# Patient Record
Sex: Female | Born: 1985
Health system: Southern US, Community
[De-identification: ages and names within clinical notes are randomized; demographics above are authoritative.]

## PROBLEM LIST (undated history)

## (undated) DIAGNOSIS — K589 Irritable bowel syndrome without diarrhea: Secondary | ICD-10-CM

## (undated) DIAGNOSIS — F32A Depression, unspecified: Secondary | ICD-10-CM

## (undated) DIAGNOSIS — T7840XA Allergy, unspecified, initial encounter: Secondary | ICD-10-CM

## (undated) DIAGNOSIS — R112 Nausea with vomiting, unspecified: Secondary | ICD-10-CM

## (undated) DIAGNOSIS — Z789 Other specified health status: Secondary | ICD-10-CM

## (undated) DIAGNOSIS — F329 Major depressive disorder, single episode, unspecified: Secondary | ICD-10-CM

## (undated) DIAGNOSIS — F988 Other specified behavioral and emotional disorders with onset usually occurring in childhood and adolescence: Secondary | ICD-10-CM

## (undated) DIAGNOSIS — Z9889 Other specified postprocedural states: Secondary | ICD-10-CM

## (undated) DIAGNOSIS — F419 Anxiety disorder, unspecified: Secondary | ICD-10-CM

## (undated) HISTORY — DX: Allergy, unspecified, initial encounter: T78.40XA

## (undated) HISTORY — DX: Anxiety disorder, unspecified: F41.9

## (undated) HISTORY — PX: ANTERIOR CRUCIATE LIGAMENT REPAIR: SHX115

## (undated) HISTORY — DX: Other specified behavioral and emotional disorders with onset usually occurring in childhood and adolescence: F98.8

---
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
## 2007-09-18 ENCOUNTER — Encounter: Admission: RE | Admit: 2007-09-18 | Discharge: 2007-09-18 | Payer: Self-pay | Admitting: General Surgery

## 2008-01-05 ENCOUNTER — Emergency Department (HOSPITAL_BASED_OUTPATIENT_CLINIC_OR_DEPARTMENT_OTHER): Admission: EM | Admit: 2008-01-05 | Discharge: 2008-01-05 | Payer: Self-pay | Admitting: Emergency Medicine

## 2010-01-01 NOTE — L&D Delivery Note (Signed)
Patient was C/C/2 and pushed for 15 minutes with epidural.   NSVD  female infant, Apgars 4,6, P; weight P.   The patient had one perineal lacerations repaired with 2-0 vicryl R. Fundus was firm. EBL was expected. Placenta was delivered intact. Vagina was clear.  Baby was pink and active but grunting; Neo called and Baby to [facility]u for grunting.  Aditi Rovira A

## 2010-01-22 ENCOUNTER — Encounter: Payer: Self-pay | Admitting: Gastroenterology

## 2010-04-17 LAB — URINALYSIS, ROUTINE W REFLEX MICROSCOPIC
Bilirubin Urine: NEGATIVE
Glucose, UA: NEGATIVE mg/dL
Nitrite: NEGATIVE
Protein, ur: NEGATIVE mg/dL
Specific Gravity, Urine: 1.028 (ref 1.005–1.030)
pH: 6 (ref 5.0–8.0)

## 2010-04-17 LAB — BASIC METABOLIC PANEL
Calcium: 9.3 mg/dL (ref 8.4–10.5)
Creatinine, Ser: 0.7 mg/dL (ref 0.4–1.2)
GFR calc Af Amer: >60 mL/min (ref >60)
GFR calc non Af Amer: >60 mL/min (ref >60)
Glucose, Bld: 134 mg/dL — ABNORMAL HIGH (ref 70–99)

## 2010-04-17 LAB — URINE MICROSCOPIC-ADD ON

## 2010-04-20 LAB — HIV ANTIBODY (ROUTINE TESTING W REFLEX): HIV: NONREACTIVE

## 2010-04-20 LAB — RUBELLA ANTIBODY, IGM: Rubella: IMMUNE

## 2010-04-20 LAB — ABO/RH: RH Type: POSITIVE

## 2010-04-20 LAB — HEPATITIS B SURFACE ANTIGEN: Hepatitis B Surface Ag: NEGATIVE

## 2010-04-20 LAB — ANTIBODY SCREEN: Antibody Screen: NEGATIVE

## 2010-10-03 ENCOUNTER — Encounter (HOSPITAL_COMMUNITY): Payer: Self-pay | Admitting: *Deleted

## 2010-10-03 ENCOUNTER — Inpatient Hospital Stay (HOSPITAL_COMMUNITY)
Admission: AD | Admit: 2010-10-03 | Discharge: 2010-10-07 | DRG: 774 | Disposition: A | Discharge status: Home / Self Care | Payer: 59 | Source: Ambulatory Visit | Attending: Obstetrics & Gynecology | Admitting: Obstetrics & Gynecology

## 2010-10-03 DIAGNOSIS — O1490 Unspecified pre-eclampsia, unspecified trimester: Secondary | ICD-10-CM

## 2010-10-03 DIAGNOSIS — O1414 Severe pre-eclampsia complicating childbirth: Principal | ICD-10-CM | POA: Diagnosis present

## 2010-10-03 HISTORY — DX: Other specified health status: Z78.9

## 2010-10-03 HISTORY — DX: Depression, unspecified: F32.A

## 2010-10-03 HISTORY — DX: Other specified postprocedural states: Z98.890

## 2010-10-03 HISTORY — DX: Nausea with vomiting, unspecified: R11.2

## 2010-10-03 HISTORY — DX: Irritable bowel syndrome, unspecified: K58.9

## 2010-10-03 HISTORY — DX: Major depressive disorder, single episode, unspecified: F32.9

## 2010-10-03 LAB — COMPREHENSIVE METABOLIC PANEL
Alkaline Phosphatase: 146 U/L — ABNORMAL HIGH (ref 39–117)
Calcium: 9.2 mg/dL (ref 8.4–10.5)
Chloride: 103 mEq/L (ref 96–112)
GFR calc Af Amer: >90 mL/min (ref >90)
Potassium: 3.8 mEq/L (ref 3.5–5.1)

## 2010-10-03 LAB — CBC
HCT: 28.5 % — ABNORMAL LOW (ref 36.0–46.0)
Hemoglobin: 9.7 g/dL — ABNORMAL LOW (ref 12.0–15.0)
MCH: 31.6 pg (ref 26.0–34.0)
MCHC: 34 g/dL (ref 30.0–36.0)
MCV: 92.8 fL (ref 78.0–100.0)
Platelets: 216 10*3/uL (ref 150–400)
RBC: 3.08 MIL/uL — ABNORMAL LOW (ref 3.87–5.11)
RDW: 13.6 % (ref 11.5–15.5)
WBC: 7.8 10*3/uL (ref 4.0–10.5)
WBC: 8.4 10*3/uL (ref 4.0–10.5)

## 2010-10-03 LAB — URINALYSIS, ROUTINE W REFLEX MICROSCOPIC
Bilirubin Urine: NEGATIVE
Ketones, ur: NEGATIVE mg/dL
Protein, ur: >300 mg/dL — AB
pH: 6 (ref 5.0–8.0)

## 2010-10-03 LAB — URINE MICROSCOPIC-ADD ON

## 2010-10-03 LAB — LACTATE DEHYDROGENASE: LDH: 152 U/L (ref 94–250)

## 2010-10-03 MED ORDER — FLEET ENEMA 7-19 GM/118ML RE ENEM: 1.0000 | ENEMA | RECTAL | Status: DC | PRN

## 2010-10-03 MED ORDER — IBUPROFEN 600 MG PO TABS
600.0000 mg | ORAL_TABLET | Freq: Four times a day (QID) | ORAL | Status: DC | PRN
  Administered 2010-10-04: 600 mg via ORAL
  Filled 2010-10-03: qty 1

## 2010-10-03 MED ORDER — IBUPROFEN 600 MG PO TABS: 600.0000 mg | ORAL_TABLET | Freq: Four times a day (QID) | ORAL | Status: DC | PRN

## 2010-10-03 MED ORDER — LACTATED RINGERS IV SOLN: 500.0000 mL | INTRAVENOUS | Status: DC | PRN

## 2010-10-03 MED ORDER — PENICILLIN G POTASSIUM 5000000 UNITS IJ SOLR
5.0000 10*6.[IU] | Freq: Once | INTRAVENOUS | Status: DC
  Filled 2010-10-03: qty 5

## 2010-10-03 MED ORDER — OXYTOCIN 20 UNITS IN LACTATED RINGERS INFUSION - SIMPLE: 125.0000 mL/h | Freq: Once | INTRAVENOUS | Status: DC

## 2010-10-03 MED ORDER — ZOLPIDEM TARTRATE 10 MG PO TABS
10.0000 mg | ORAL_TABLET | Freq: Once | ORAL | Status: AC
  Administered 2010-10-03: 10 mg via ORAL
  Filled 2010-10-03: qty 1

## 2010-10-03 MED ORDER — OXYTOCIN 20 UNITS IN LACTATED RINGERS INFUSION - SIMPLE
125.0000 mL/h | Freq: Once | INTRAVENOUS | Status: AC
  Administered 2010-10-04: 999 mL/h via INTRAVENOUS

## 2010-10-03 MED ORDER — PENICILLIN G POTASSIUM 5000000 UNITS IJ SOLR
2.5000 10*6.[IU] | INTRAVENOUS | Status: DC
  Filled 2010-10-03: qty 2.5

## 2010-10-03 MED ORDER — OXYTOCIN 20 UNITS IN LACTATED RINGERS INFUSION - SIMPLE
1.0000 m[IU]/min | INTRAVENOUS | Status: DC
  Administered 2010-10-04: 2 m[IU]/min via INTRAVENOUS
  Administered 2010-10-04: 33.333 m[IU]/min via INTRAVENOUS
  Filled 2010-10-03 (×2): qty 1000

## 2010-10-03 MED ORDER — MISOPROSTOL 25 MCG QUARTER TABLET
25.0000 ug | ORAL_TABLET | ORAL | Status: DC | PRN
  Administered 2010-10-03 – 2010-10-04 (×2): 25 ug via VAGINAL
  Filled 2010-10-03 (×2): qty 0.25
  Filled 2010-10-03: qty 1

## 2010-10-03 MED ORDER — ACETAMINOPHEN 325 MG PO TABS: 650.0000 mg | ORAL_TABLET | ORAL | Status: DC | PRN

## 2010-10-03 MED ORDER — CITRIC ACID-SODIUM CITRATE 334-500 MG/5ML PO SOLN: 30.0000 mL | ORAL | Status: DC | PRN

## 2010-10-03 MED ORDER — TERBUTALINE SULFATE 1 MG/ML IJ SOLN: 0.2500 mg | Freq: Once | INTRAMUSCULAR | Status: AC | PRN

## 2010-10-03 MED ORDER — ONDANSETRON 4 MG PO TBDP: 4.0000 mg | ORAL_TABLET | Freq: Once | ORAL | Status: DC

## 2010-10-03 MED ORDER — OXYCODONE-ACETAMINOPHEN 5-325 MG PO TABS: 2.0000 | ORAL_TABLET | ORAL | Status: DC | PRN

## 2010-10-03 MED ORDER — LACTATED RINGERS IV SOLN
INTRAVENOUS | Status: DC
  Administered 2010-10-03 – 2010-10-04 (×2): via INTRAVENOUS

## 2010-10-03 MED ORDER — ONDANSETRON HCL 4 MG/2ML IJ SOLN: 4.0000 mg | Freq: Four times a day (QID) | INTRAMUSCULAR | Status: DC | PRN

## 2010-10-03 MED ORDER — OXYTOCIN BOLUS FROM INFUSION
500.0000 mL | Freq: Once | INTRAVENOUS | Status: DC
  Filled 2010-10-03: qty 500

## 2010-10-03 MED ORDER — BUTORPHANOL TARTRATE 2 MG/ML IJ SOLN: 1.0000 mg | INTRAMUSCULAR | Status: DC | PRN

## 2010-10-03 MED ORDER — LIDOCAINE HCL (PF) 1 % IJ SOLN: 30.0000 mL | INTRAMUSCULAR | Status: DC | PRN

## 2010-10-03 MED ORDER — LACTATED RINGERS IV SOLN: INTRAVENOUS | Status: DC

## 2010-10-03 MED ORDER — ONDANSETRON HCL 4 MG/2ML IJ SOLN
4.0000 mg | Freq: Four times a day (QID) | INTRAMUSCULAR | Status: DC | PRN
  Administered 2010-10-04 (×2): 4 mg via INTRAVENOUS
  Filled 2010-10-03 (×2): qty 2

## 2010-10-03 MED ORDER — LIDOCAINE HCL (PF) 1 % IJ SOLN
30.0000 mL | INTRAMUSCULAR | Status: AC | PRN
  Administered 2010-10-04: 30 mL via SUBCUTANEOUS
  Filled 2010-10-03: qty 30

## 2010-10-03 NOTE — Progress Notes (Signed)
Pt states she was sent over by the office

## 2010-10-03 NOTE — ED Provider Notes (Signed)
Reviewed

## 2010-10-03 NOTE — H&P (Signed)
  25 y.o. G1P0  Estimated Date of Delivery: 28 Oct. admitted at 36/[redacted] weeks gestation for induction for preclampsia. Prenatal course complicated by intermittent blood pressure elevation as high as 150/90.  She developed 3+ proteinuria.  24 hour urine protein is pending.  BPP today was 8/8 but EFW was 4 lbs 12 oz which is <10th%ile.   Prenatal labs: Blood Type:O+.  Screening tests for HIV, Syphilis, Hepatitis B, Rubella sensitivity, gestational diabetes, and perineal group B strep colonization were negative.    Afebrile, BP 140/90 - 130/85. Heart and Lungs: No active disease Abdomen: soft, gravid, EFW 4 lbs 12 oz on [place]. Cervical exam:  2/60, posterior, soft.  Vtx. -2.  Labs: Uric acid 6.3, LFT's normal, Plats >200,000.  Impression: Mild preeclampsia based mostly on proteinuria but with moderate BP elevations above her baseline with some >140/90.  Possible FGR.  Plan:  Cytotec tonight, pitocin in AM.

## 2010-10-03 NOTE — ED Provider Notes (Signed)
G 1 at 36/2 weeks presented to office today with 3+ protein, BP 150/90, 2+ edema, 8/8 BPP, EFW 4lb 12 oz (symmetric) <10 %ile.  Will observe in MAU and check 24 hr. Urine protein (already collected) and PIH panel.  If there are any lab values that confirm pre-eclampsia or if her BP remains elevated, she will be admitted for induction. Cervix is 2/60, posterior, soft, vertex -2.

## 2010-10-03 NOTE — ED Provider Notes (Signed)
History     No chief complaint on file.  HPI Pt is 36weeks 2 days pregnant sent over from the office after being seen with BP 150/90 2+ edema 3+proteinuria.  Pt had BPP 8/8.  Cervix 2cm 60% effaced soft posterior vtx -2 station.  Pt denies spotting or bleeding- having occ mild ctx.  Pt being seen for PIH eval- labs ordered by Dr.Kaplan.  Pt denies headaches or RUQ pain.     Past Medical History  Diagnosis Date  . No pertinent past medical history     Past Surgical History  Procedure Date  . Anterior cruciate ligament repair     No family history on file.  History  Substance Use Topics  . Smoking status: Not on file  . Smokeless tobacco: Not on file  . Alcohol Use: Not on file    Allergies:  Allergies  Allergen Reactions  . Sulfa Antibiotics Anaphylaxis    Prescriptions prior to admission  Medication Sig Dispense Refill  . folic acid (FOLVITE) 1 MG tablet Take 1 mg by mouth daily.        . ondansetron (ZOFRAN) 8 MG tablet Take by mouth every 8 (eight) hours as needed. For nausea         ROS Physical Exam   Blood pressure 142/91, pulse 82, temperature 98.3 F (36.8 C), temperature source Oral, resp. rate 18, weight 188 lb (85.276 kg).  Physical Exam  Vitals reviewed. Constitutional: She is oriented to person, place, and time. She appears well-developed and well-nourished.  HENT:  Head: Normocephalic.  Eyes: Pupils are equal, round, and reactive to light.  Neck: Normal range of motion.  Cardiovascular: Normal rate.   GI: Soft.  Musculoskeletal: Normal range of motion. She exhibits edema.  Neurological: She is alert and oriented to person, place, and time.  Skin: Skin is warm and dry.  Psychiatric: She has a normal mood and affect.    MAU Course  Procedures CMET ordered by Dr. Arlyce Dice Obstetric panel ordered by Dr. Arlyce Dice- not an inpatient lab order CBC and LDH ordered  Urinalysis ordered since there is not one in our system Pt has completed a 24 hours  urine and turned in to the office today NST- reactive Discussed labs and pt's clinical status with Dr. Arlyce Dice- he will come and see pt Dr. Arlyce Dice here to see pt and admit  Assessment and Plan    Mackenzie Curry 10/03/2010, 4:44 PM

## 2010-10-04 ENCOUNTER — Encounter (HOSPITAL_COMMUNITY): Payer: Self-pay | Admitting: *Deleted

## 2010-10-04 ENCOUNTER — Other Ambulatory Visit: Payer: Self-pay | Admitting: Obstetrics and Gynecology

## 2010-10-04 ENCOUNTER — Encounter (HOSPITAL_COMMUNITY): Payer: Self-pay | Admitting: Anesthesiology

## 2010-10-04 ENCOUNTER — Encounter (HOSPITAL_COMMUNITY): Payer: Self-pay

## 2010-10-04 ENCOUNTER — Inpatient Hospital Stay (HOSPITAL_COMMUNITY): Payer: 59 | Admitting: Anesthesiology

## 2010-10-04 LAB — CBC
HCT: 26.7 % — ABNORMAL LOW (ref 36.0–46.0)
MCH: 31.4 pg (ref 26.0–34.0)
MCHC: 33.9 g/dL (ref 30.0–36.0)
MCV: 91.8 fL (ref 78.0–100.0)
Platelets: 207 10*3/uL (ref 150–400)
RBC: 2.91 MIL/uL — ABNORMAL LOW (ref 3.87–5.11)
RDW: 13.6 % (ref 11.5–15.5)
WBC: 16.7 10*3/uL — ABNORMAL HIGH (ref 4.0–10.5)

## 2010-10-04 LAB — COMPREHENSIVE METABOLIC PANEL
ALT: 5 U/L (ref 0–35)
Albumin: 2.5 g/dL — ABNORMAL LOW (ref 3.5–5.2)
Alkaline Phosphatase: 146 U/L — ABNORMAL HIGH (ref 39–117)
Calcium: 8.6 mg/dL (ref 8.4–10.5)
Potassium: 3.9 mEq/L (ref 3.5–5.1)
Sodium: 135 mEq/L (ref 135–145)
Total Protein: 5.8 g/dL — ABNORMAL LOW (ref 6.0–8.3)

## 2010-10-04 LAB — URIC ACID: Uric Acid, Serum: 6.2 mg/dL (ref 2.4–7.0)

## 2010-10-04 LAB — RPR: RPR Ser Ql: NONREACTIVE

## 2010-10-04 MED ORDER — PHENYLEPHRINE 40 MCG/ML (10ML) SYRINGE FOR IV PUSH (FOR BLOOD PRESSURE SUPPORT)
80.0000 ug | PREFILLED_SYRINGE | INTRAVENOUS | Status: DC | PRN
  Filled 2010-10-04: qty 5

## 2010-10-04 MED ORDER — SENNOSIDES-DOCUSATE SODIUM 8.6-50 MG PO TABS
2.0000 | ORAL_TABLET | Freq: Every day | ORAL | Status: DC
  Administered 2010-10-04 – 2010-10-06 (×3): 2 via ORAL

## 2010-10-04 MED ORDER — DIPHENHYDRAMINE HCL 50 MG/ML IJ SOLN: 12.5000 mg | INTRAMUSCULAR | Status: DC | PRN

## 2010-10-04 MED ORDER — FENTANYL 2.5 MCG/ML BUPIVACAINE 1/10 % EPIDURAL INFUSION (WH - ANES)
INTRAMUSCULAR | Status: DC | PRN
  Administered 2010-10-04: 14 mL/h via EPIDURAL

## 2010-10-04 MED ORDER — METHYLERGONOVINE MALEATE 0.2 MG/ML IJ SOLN: 0.2000 mg | INTRAMUSCULAR | Status: DC | PRN

## 2010-10-04 MED ORDER — DIBUCAINE 1 % RE OINT
1.0000 "application " | TOPICAL_OINTMENT | RECTAL | Status: DC | PRN
  Filled 2010-10-04: qty 28

## 2010-10-04 MED ORDER — PRENATAL PLUS 27-1 MG PO TABS
1.0000 | ORAL_TABLET | Freq: Every day | ORAL | Status: DC
  Administered 2010-10-05 – 2010-10-07 (×3): 1 via ORAL
  Filled 2010-10-04 (×3): qty 1

## 2010-10-04 MED ORDER — PROMETHAZINE HCL 25 MG/ML IJ SOLN
12.5000 mg | Freq: Four times a day (QID) | INTRAMUSCULAR | Status: DC | PRN
  Administered 2010-10-04: 12.5 mg via INTRAVENOUS
  Filled 2010-10-04: qty 1

## 2010-10-04 MED ORDER — SODIUM CHLORIDE 0.9 % IV SOLN: 250.0000 mL | INTRAVENOUS | Status: DC

## 2010-10-04 MED ORDER — METHYLERGONOVINE MALEATE 0.2 MG PO TABS: 0.2000 mg | ORAL_TABLET | ORAL | Status: DC | PRN

## 2010-10-04 MED ORDER — ZOLPIDEM TARTRATE 5 MG PO TABS: 5.0000 mg | ORAL_TABLET | Freq: Every evening | ORAL | Status: DC | PRN

## 2010-10-04 MED ORDER — LIDOCAINE HCL 1.5 % IJ SOLN
INTRAMUSCULAR | Status: DC | PRN
  Administered 2010-10-04 (×2): 4 mL via INTRADERMAL
  Administered 2010-10-04: 5 mL via INTRADERMAL
  Administered 2010-10-04 (×3): 4 mL via INTRADERMAL

## 2010-10-04 MED ORDER — PHENYLEPHRINE 40 MCG/ML (10ML) SYRINGE FOR IV PUSH (FOR BLOOD PRESSURE SUPPORT): 80.0000 ug | PREFILLED_SYRINGE | INTRAVENOUS | Status: DC | PRN

## 2010-10-04 MED ORDER — OXYTOCIN 20 UNITS IN LACTATED RINGERS INFUSION - SIMPLE: INTRAVENOUS | Status: DC | PRN

## 2010-10-04 MED ORDER — LANOLIN HYDROUS EX OINT: TOPICAL_OINTMENT | CUTANEOUS | Status: DC | PRN

## 2010-10-04 MED ORDER — WITCH HAZEL-GLYCERIN EX PADS: 1.0000 "application " | MEDICATED_PAD | CUTANEOUS | Status: DC | PRN

## 2010-10-04 MED ORDER — FENTANYL 2.5 MCG/ML BUPIVACAINE 1/10 % EPIDURAL INFUSION (WH - ANES)
14.0000 mL/h | INTRAMUSCULAR | Status: DC
  Administered 2010-10-04 (×3): 14 mL/h via EPIDURAL
  Filled 2010-10-04 (×4): qty 60

## 2010-10-04 MED ORDER — METOCLOPRAMIDE HCL 5 MG/ML IJ SOLN
10.0000 mg | Freq: Once | INTRAMUSCULAR | Status: AC | PRN
  Administered 2010-10-04: 10 mg via INTRAVENOUS
  Filled 2010-10-04: qty 2

## 2010-10-04 MED ORDER — ONDANSETRON HCL 4 MG/2ML IJ SOLN: 4.0000 mg | INTRAMUSCULAR | Status: DC | PRN

## 2010-10-04 MED ORDER — LACTATED RINGERS IV SOLN
500.0000 mL | Freq: Once | INTRAVENOUS | Status: AC
  Administered 2010-10-04: 500 mL via INTRAVENOUS

## 2010-10-04 MED ORDER — FERROUS SULFATE 325 (65 FE) MG PO TABS
325.0000 mg | ORAL_TABLET | Freq: Two times a day (BID) | ORAL | Status: DC
  Administered 2010-10-04 – 2010-10-07 (×4): 325 mg via ORAL
  Filled 2010-10-04 (×4): qty 1

## 2010-10-04 MED ORDER — OXYTOCIN 10 UNIT/ML IJ SOLN
INTRAMUSCULAR | Status: AC
  Administered 2010-10-04: 20 [IU]
  Filled 2010-10-04: qty 2

## 2010-10-04 MED ORDER — EPHEDRINE 5 MG/ML INJ
10.0000 mg | INTRAVENOUS | Status: DC | PRN
  Filled 2010-10-04 (×2): qty 4

## 2010-10-04 MED ORDER — IBUPROFEN 800 MG PO TABS
800.0000 mg | ORAL_TABLET | Freq: Three times a day (TID) | ORAL | Status: DC
  Administered 2010-10-04 – 2010-10-07 (×8): 800 mg via ORAL
  Filled 2010-10-04 (×8): qty 1

## 2010-10-04 MED ORDER — TETANUS-DIPHTH-ACELL PERTUSSIS 5-2.5-18.5 LF-MCG/0.5 IM SUSP
0.5000 mL | Freq: Once | INTRAMUSCULAR | Status: DC
  Filled 2010-10-04: qty 0.5

## 2010-10-04 MED ORDER — MAGNESIUM SULFATE 40 G IN LACTATED RINGERS - SIMPLE
2.0000 g/h | INTRAVENOUS | Status: AC
  Administered 2010-10-04 – 2010-10-05 (×2): 2 g/h via INTRAVENOUS
  Filled 2010-10-04 (×2): qty 500

## 2010-10-04 MED ORDER — EPHEDRINE 5 MG/ML INJ
10.0000 mg | INTRAVENOUS | Status: DC | PRN
  Filled 2010-10-04: qty 4

## 2010-10-04 MED ORDER — SIMETHICONE 80 MG PO CHEW: 80.0000 mg | CHEWABLE_TABLET | ORAL | Status: DC | PRN

## 2010-10-04 MED ORDER — SODIUM CHLORIDE 0.9 % IJ SOLN
3.0000 mL | Freq: Two times a day (BID) | INTRAMUSCULAR | Status: DC
  Administered 2010-10-05: 3 mL via INTRAVENOUS

## 2010-10-04 MED ORDER — ONDANSETRON HCL 4 MG PO TABS
4.0000 mg | ORAL_TABLET | ORAL | Status: DC | PRN
  Administered 2010-10-05: 4 mg via ORAL
  Filled 2010-10-04: qty 1

## 2010-10-04 MED ORDER — MAGNESIUM HYDROXIDE 400 MG/5ML PO SUSP: 30.0000 mL | ORAL | Status: DC | PRN

## 2010-10-04 MED ORDER — MEASLES, MUMPS & RUBELLA VAC ~~LOC~~ INJ: 0.5000 mL | INJECTION | Freq: Once | SUBCUTANEOUS | Status: DC

## 2010-10-04 MED ORDER — BENZOCAINE-MENTHOL 20-0.5 % EX AERO
1.0000 "application " | INHALATION_SPRAY | CUTANEOUS | Status: DC | PRN
  Administered 2010-10-05: 1 via TOPICAL
  Filled 2010-10-04: qty 56

## 2010-10-04 MED ORDER — DIPHENHYDRAMINE HCL 25 MG PO CAPS: 25.0000 mg | ORAL_CAPSULE | Freq: Four times a day (QID) | ORAL | Status: DC | PRN

## 2010-10-04 MED ORDER — SODIUM CHLORIDE 0.9 % IJ SOLN
3.0000 mL | INTRAMUSCULAR | Status: DC | PRN
  Administered 2010-10-05: 3 mL via INTRAVENOUS

## 2010-10-04 MED ORDER — MAGNESIUM SULFATE BOLUS VIA INFUSION
4.0000 g | Freq: Once | INTRAVENOUS | Status: AC
  Administered 2010-10-04: 4 g via INTRAVENOUS
  Filled 2010-10-04: qty 500

## 2010-10-04 MED ORDER — OXYCODONE-ACETAMINOPHEN 5-325 MG PO TABS
1.0000 | ORAL_TABLET | ORAL | Status: DC | PRN
  Administered 2010-10-04 – 2010-10-05 (×4): 1 via ORAL
  Filled 2010-10-04 (×4): qty 1

## 2010-10-04 NOTE — Plan of Care (Signed)
Problem: Consults Goal: Birthing Suites Patient Information Press F2 to bring up selections list  Outcome: Completed/Met Date Met:  10/04/10  Pt < [redacted] weeks EGA

## 2010-10-04 NOTE — Progress Notes (Signed)
Pt not c/o any s/s pre-e.    Filed Vitals:   10/04/10 1230 10/04/10 1231 10/04/10 1301 10/04/10 1331  BP:  133/85 137/90 142/78  Pulse:  94 96 92  Temp:      TempSrc:      Resp: 18  18 20   Height:      Weight:      SpO2:      SVE 25/80/-2  FHTs 140s, nstR, good stv Toco q1-89min. Lab Results  Component Value Date   WBC 9.1 10/04/2010   HGB 9.7* 10/04/2010   HCT 28.6* 10/04/2010   MCV 92.6 10/04/2010   PLT 207 10/04/2010   CMET nml. A/P Severe pre-e at 36 weeks, induction, on MgSo4.

## 2010-10-04 NOTE — Anesthesia Preprocedure Evaluation (Signed)
Anesthesia Evaluation  General Assessment Comment  Airway Mallampati: II TM Distance: >3 FB Neck ROM: Full    Dental No notable dental hx. (+) Teeth Intact   Pulmonary  clear to auscultation        Cardiovascular Regular Normal    Neuro/Psych Negative Neurological ROS     GI/Hepatic negative GI ROS Neg liver ROS    Endo/Other  Negative Endocrine ROS  Renal/GU negative Renal ROS  Genitourinary negative   Musculoskeletal negative musculoskeletal ROS (+)   Abdominal   Peds  Hematology negative hematology ROS (+)   Anesthesia Other Findings   Reproductive/Obstetrics negative OB ROS                           Anesthesia Physical Anesthesia Plan  ASA: III  Anesthesia Plan: Epidural   Post-op Pain Management:    Induction:   Airway Management Planned:   Additional Equipment:   Intra-op Plan:   Post-operative Plan:   Informed Consent: I have reviewed the patients History and Physical, chart, labs and discussed the procedure including the risks, benefits and alternatives for the proposed anesthesia with the patient or authorized representative who has indicated his/her understanding and acceptance.     Plan Discussed with: Anesthesiologist  Anesthesia Plan Comments:         Anesthesia Quick Evaluation

## 2010-10-04 NOTE — Anesthesia Procedure Notes (Addendum)
Epidural Patient location during procedure: OB Start time: 10/04/2010 8:54 AM  Staffing Anesthesiologist: Seward Coran A. Performed by: anesthesiologist   Preanesthetic Checklist Completed: patient identified, site marked, surgical consent, pre-op evaluation, timeout performed, IV checked, risks and benefits discussed and monitors and equipment checked  Epidural Patient position: sitting Prep: site prepped and draped and DuraPrep Patient monitoring: continuous pulse ox and blood pressure Approach: midline Injection technique: LOR air  Needle:  Needle type: Tuohy  Needle gauge: 17 G Needle length: 9 cm Needle insertion depth: 5 cm cm Catheter type: closed end flexible Catheter size: 19 Gauge Catheter at skin depth: 10 cm Test dose: negative and 1.5% lidocaine  Assessment Sensory level: T8 Events: blood not aspirated, injection not painful, no injection resistance, negative IV test and no paresthesia  Additional Notes Patient is more comfortable after epidural dosed. Please see RN's note for documentation of vital signs and FHR which are stable.   Epidural Patient location during procedure: OB Start time: 10/04/2010 2:19 PM  Staffing Anesthesiologist: Lalia Loudon A. Performed by: anesthesiologist   Epidural Patient position: sitting Prep: site prepped and draped and DuraPrep Patient monitoring: blood pressure and continuous pulse ox Injection technique: LOR air  Needle:  Needle type: Tuohy  Needle gauge: 17 G Needle length: 9 cm Needle insertion depth: 6 cm Catheter at skin depth: 11 cm Test dose: 1.5% lidocaine and negative  Additional Notes Epidural catheter with heme in tip. Initial epidural catheter removed and replaced as above. Patient states she feels more numb. Still feeling pressure with contractions. Please see RN's note for documentation of vital signs and FHR which are stable.

## 2010-10-04 NOTE — Progress Notes (Signed)
Pt not c/o any s/s pre-e.    Filed Vitals:   10/03/10 2329 10/04/10 0210 10/04/10 0552 10/04/10 0731  BP: 134/65 134/66 134/88 137/88  Pulse: 90 79 77 92  Temp: 97.9 F (36.6 C) 98.1 F (36.7 C) 98.1 F (36.7 C)   TempSrc: Oral Oral Oral   Resp: 20 20 20 20   Height:      Weight:      SVE 2/80/-2  FHTs 140s, nstR, good stv Toco q1-74min.  A/P Severe pre-e at 36 weeks, induction.  Repeat labs and start MgSO4.

## 2010-10-05 LAB — COMPREHENSIVE METABOLIC PANEL
ALT: <5 U/L (ref 0–35)
Calcium: 7 mg/dL — ABNORMAL LOW (ref 8.4–10.5)
Creatinine, Ser: 0.47 mg/dL — ABNORMAL LOW (ref 0.50–1.10)
GFR calc Af Amer: >90 mL/min (ref >90)
GFR calc non Af Amer: >90 mL/min (ref >90)
Glucose, Bld: 87 mg/dL (ref 70–99)
Sodium: 136 mEq/L (ref 135–145)
Total Protein: 4.8 g/dL — ABNORMAL LOW (ref 6.0–8.3)

## 2010-10-05 LAB — URIC ACID: Uric Acid, Serum: 6.2 mg/dL (ref 2.4–7.0)

## 2010-10-05 LAB — CBC
Hemoglobin: 8.2 g/dL — ABNORMAL LOW (ref 12.0–15.0)
MCH: 31.3 pg (ref 26.0–34.0)
MCHC: 33.7 g/dL (ref 30.0–36.0)
MCV: 92.7 fL (ref 78.0–100.0)

## 2010-10-05 LAB — MRSA PCR SCREENING: MRSA by PCR: NEGATIVE

## 2010-10-05 MED ORDER — LACTATED RINGERS IV SOLN
INTRAVENOUS | Status: AC
  Administered 2010-10-05: 14:00:00 via INTRAVENOUS
  Administered 2010-10-05: 1000 mL via INTRAVENOUS

## 2010-10-05 MED ORDER — INFLUENZA VIRUS VACC SPLIT PF IM SUSP
0.5000 mL | Freq: Once | INTRAMUSCULAR | Status: DC
  Filled 2010-10-05: qty 0.5

## 2010-10-05 NOTE — Anesthesia Postprocedure Evaluation (Signed)
  Anesthesia Post-op Note  Patient: Mackenzie Curry  Procedure(s) Performed: * No procedures listed *  Patient Location: PACU and Mother/Baby  Anesthesia Type: Epidural  Level of Consciousness: awake, alert  and oriented  Airway and Oxygen Therapy: Patient Spontanous Breathing  Post-op Pain: none  Post-op Assessment: Post-op Vital signs reviewed and Patient's Cardiovascular Status Stable  Post-op Vital Signs: Reviewed and stable  Complications: No apparent anesthesia complications

## 2010-10-05 NOTE — Progress Notes (Signed)
UR Chart review completed.  

## 2010-10-05 NOTE — Progress Notes (Signed)
Patient tolerating magnesium sulfate.  Denies HA/ vision change, RUQ pain. Patient is eating, ambulating, voiding.  Pain control is good.  Filed Vitals:   10/05/10 0500 10/05/10 0600 10/05/10 0700 10/05/10 0732  BP:  122/75 132/87   Pulse:  73 92   Temp:    97.6 F (36.4 C)  TempSrc:    Oral  Resp: 18 20 20    Height:      Weight:      SpO2:        Fundus firm No CT  Lab Results  Component Value Date   WBC 11.5* 10/05/2010   HGB 8.2* 10/05/2010   HCT 24.3* 10/05/2010   MCV 92.7 10/05/2010   PLT 173 10/05/2010    --/--/O POS (10/03 0830)  A/P Post partum day 1 Continue mag until 24hrs pp. Vitals per mag protocol. FeSO4 bid for Hb 8.2 Circ desired, baby currently in NICU.  Routine care.  Expect d/c per plan.    Philip Aspen

## 2010-10-05 NOTE — Anesthesia Postprocedure Evaluation (Signed)
  Anesthesia Post-op Note  Patient: Mackenzie Curry  Procedure(s) Performed: * Lumbar Epidural for L&D *  Patient Location: Mother/Baby  Anesthesia Type: Epidural  Level of Consciousness: awake, alert  and oriented  Airway and Oxygen Therapy: Patient Spontanous Breathing  Post-op Pain: none  Post-op Assessment: Post-op Vital signs reviewed, Patient's Cardiovascular Status Stable, Respiratory Function Stable, Patent Airway, No signs of Nausea or vomiting, Pain level controlled, No headache, No backache, No residual numbness and No residual motor weakness  Post-op Vital Signs: Reviewed and stable  Complications: No apparent anesthesia complications

## 2010-10-05 NOTE — Progress Notes (Signed)
Encounter addended by: Cephus Shelling on: 10/05/2010 10:58 AM<BR>     Documentation filed: Notes Section, Charges VN

## 2010-10-06 NOTE — Progress Notes (Signed)
Mom stable; BP ok.  Magnesium sulfate d/ced yesterday about 4PM.  Baby in NICU -- stable  - to feed today.  Remains on antibiotics and will need to be circ'ed before discharge.    Will keep Mom today to allow continued observation as Magnesium Sulfate stopped yesterday at 4PM.. Also will allow Mom to be with baby who remains in NICU.

## 2010-10-06 NOTE — Progress Notes (Signed)
UR Chart review completed.  

## 2010-10-06 NOTE — Progress Notes (Signed)
CLINICAL SOCIAL WORK  BRIEF PSYCHOSOCIAL ASSESSMENT  Referred by: NICU     On: 10/06/10    For: NICU Support      Patient Interview_X_ Family Interview_X_  Other:   PSYCHOSOCIAL DATA:   Lives Alone  Lives with: Baby to discharge to parent's home  Primary Support (Name/Relationship): Christina and Dustin Homer Degree of support available:   CURRENT CONCERNS:     _X_None noted Substance Abuse     Behavioral Health Issues    Financial Resources     Abuse/Neglect/Domestic Violence   Cultural/Religious Issues     Post-Acute Placement    Adjustment to Illness     Knowledge/Cognitive Deficit      Other:     SOCIAL WORK ASSESSMENT/PLAN:  SW met with parents in MOB's third floor room to introduce myself, complete assessment and evaluate how they is coping with baby's admission to NICU.  Parent were extremely friendly and seem to have a good understanding of baby's condition.  SW explained support services offered by NICU SWs and gave contact information.  They appear to be coping well with the situation.   No Further Intervention Required  Psychosocial Support/Ongoing Assessment of Needs_X_ Information/Referral to Community Resources Other  PATIENT'S/FAMILY'S RESPONSE TO PLAN OF CARE:  Parents state that they have everything they need for baby at home.  They report no questions, concerns or needs at this time.  

## 2010-10-07 NOTE — Discharge Summary (Signed)
Discharge diagnoses-  #1-36 week and 2 day intrauterine pregnancy delivered 5 lbs. 14 oz. Female infant Apgars 4,6 and 7  #2-blood type O-positive  #3-preeclampsia  Procedures-  Induction of labor and normal spontaneous delivery of 5 lbs. 14 oz. Female infant and repair of perineal tear  Summary-  This 25 year old gravida 1 now para 1 was admitted for induction on 10/3 because of preeclampsia. Intrapartum she was placed on magnesium sulfate. After delivery the baby was taken to the newborn intensive care unit for care and at the time of the mother's discharge was stable/improving.  Magnesium sulfate was discontinued on the afternoon of 10/4 and at the time of this discharge the patient is ambulating well, tolerating a regular diet well,having normal bowel and bladder function and is pumping without difficulty.  Her blood pressures are entirely normal at this time.  She was given in office discharge brochure at the time of discharge and understood all instructions well. She will maintain herself on a low salt diet and arranged with the NICU as far as frequent visits to see the baby. She is continuing to breast-feed. She will return to our office in approximately 4 weeks time or as needed. She will have her blood pressure checked at home by her husband who is a paramedic as needed and let us know if her pressures become elevated.  She does desire the baby to be circumcised prior to his discharge from the NICU and that will be carried out accordingly at the appropriate time.

## 2010-10-07 NOTE — Progress Notes (Signed)
D/c instructions reviewed with pt. and husband.  Both state understanding of same.  No home equipment needed.  Ambulated to car with staff without incident.  D/c'd home with husband.

## 2010-10-08 ENCOUNTER — Encounter (HOSPITAL_COMMUNITY)
Admission: RE | Admit: 2010-10-08 | Discharge: 2010-10-08 | Disposition: A | Discharge status: Home / Self Care | Payer: 59 | Source: Ambulatory Visit | Attending: Obstetrics and Gynecology | Admitting: Obstetrics and Gynecology

## 2010-10-08 DIAGNOSIS — O923 Agalactia: Secondary | ICD-10-CM | POA: Insufficient documentation

## 2010-11-08 ENCOUNTER — Encounter (HOSPITAL_COMMUNITY)
Admission: RE | Admit: 2010-11-08 | Discharge: 2010-11-08 | Disposition: A | Discharge status: Home / Self Care | Payer: 59 | Source: Ambulatory Visit | Attending: Obstetrics and Gynecology | Admitting: Obstetrics and Gynecology

## 2010-11-08 DIAGNOSIS — O923 Agalactia: Secondary | ICD-10-CM | POA: Insufficient documentation

## 2010-11-17 ENCOUNTER — Other Ambulatory Visit: Payer: Self-pay | Admitting: Obstetrics and Gynecology

## 2010-12-09 ENCOUNTER — Encounter (HOSPITAL_COMMUNITY)
Admission: RE | Admit: 2010-12-09 | Discharge: 2010-12-09 | Disposition: A | Discharge status: Home / Self Care | Payer: 59 | Source: Ambulatory Visit | Attending: Obstetrics and Gynecology | Admitting: Obstetrics and Gynecology

## 2010-12-09 DIAGNOSIS — O923 Agalactia: Secondary | ICD-10-CM | POA: Insufficient documentation

## 2011-01-01 ENCOUNTER — Inpatient Hospital Stay (HOSPITAL_COMMUNITY): Admission: AD | Admit: 2011-01-01 | Payer: Self-pay | Source: Ambulatory Visit | Admitting: Obstetrics & Gynecology

## 2011-01-09 ENCOUNTER — Encounter (HOSPITAL_COMMUNITY)
Admission: RE | Admit: 2011-01-09 | Discharge: 2011-01-09 | Disposition: A | Discharge status: Home / Self Care | Payer: 59 | Source: Ambulatory Visit | Attending: Obstetrics and Gynecology | Admitting: Obstetrics and Gynecology

## 2011-01-09 DIAGNOSIS — O923 Agalactia: Secondary | ICD-10-CM | POA: Insufficient documentation

## 2011-02-09 ENCOUNTER — Encounter (HOSPITAL_COMMUNITY)
Admission: RE | Admit: 2011-02-09 | Discharge: 2011-02-09 | Disposition: A | Discharge status: Home / Self Care | Payer: 59 | Source: Ambulatory Visit | Attending: Obstetrics and Gynecology | Admitting: Obstetrics and Gynecology

## 2011-02-09 DIAGNOSIS — O923 Agalactia: Secondary | ICD-10-CM | POA: Insufficient documentation

## 2011-03-11 ENCOUNTER — Encounter (HOSPITAL_COMMUNITY)
Admission: RE | Admit: 2011-03-11 | Discharge: 2011-03-11 | Disposition: A | Discharge status: Home / Self Care | Payer: 59 | Source: Ambulatory Visit | Attending: Obstetrics and Gynecology | Admitting: Obstetrics and Gynecology

## 2011-03-11 DIAGNOSIS — O923 Agalactia: Secondary | ICD-10-CM | POA: Insufficient documentation

## 2011-04-11 ENCOUNTER — Encounter (HOSPITAL_COMMUNITY)
Admission: RE | Admit: 2011-04-11 | Discharge: 2011-04-11 | Disposition: A | Discharge status: Home / Self Care | Payer: 59 | Source: Ambulatory Visit | Attending: Obstetrics and Gynecology | Admitting: Obstetrics and Gynecology

## 2011-04-11 DIAGNOSIS — O923 Agalactia: Secondary | ICD-10-CM | POA: Insufficient documentation

## 2011-05-12 ENCOUNTER — Encounter (HOSPITAL_COMMUNITY)
Admission: RE | Admit: 2011-05-12 | Discharge: 2011-05-12 | Disposition: A | Discharge status: Home / Self Care | Payer: 59 | Source: Ambulatory Visit | Attending: Obstetrics and Gynecology | Admitting: Obstetrics and Gynecology

## 2011-05-12 DIAGNOSIS — O923 Agalactia: Secondary | ICD-10-CM | POA: Insufficient documentation

## 2011-06-12 ENCOUNTER — Encounter (HOSPITAL_COMMUNITY)
Admission: RE | Admit: 2011-06-12 | Discharge: 2011-06-12 | Disposition: A | Discharge status: Home / Self Care | Payer: 59 | Source: Ambulatory Visit | Attending: Obstetrics and Gynecology | Admitting: Obstetrics and Gynecology

## 2011-06-12 DIAGNOSIS — O923 Agalactia: Secondary | ICD-10-CM | POA: Insufficient documentation

## 2011-07-13 ENCOUNTER — Encounter (HOSPITAL_COMMUNITY)
Admission: RE | Admit: 2011-07-13 | Discharge: 2011-07-13 | Disposition: A | Discharge status: Home / Self Care | Payer: 59 | Source: Ambulatory Visit | Attending: Obstetrics and Gynecology | Admitting: Obstetrics and Gynecology

## 2011-07-13 DIAGNOSIS — O923 Agalactia: Secondary | ICD-10-CM | POA: Insufficient documentation

## 2011-08-13 ENCOUNTER — Encounter (HOSPITAL_COMMUNITY)
Admission: RE | Admit: 2011-08-13 | Discharge: 2011-08-13 | Disposition: A | Discharge status: Home / Self Care | Payer: 59 | Source: Ambulatory Visit | Attending: Obstetrics and Gynecology | Admitting: Obstetrics and Gynecology

## 2011-08-13 DIAGNOSIS — O923 Agalactia: Secondary | ICD-10-CM | POA: Insufficient documentation

## 2011-08-28 ENCOUNTER — Other Ambulatory Visit: Payer: Self-pay

## 2011-09-11 ENCOUNTER — Other Ambulatory Visit: Payer: Self-pay

## 2012-12-18 ENCOUNTER — Other Ambulatory Visit: Payer: Self-pay | Admitting: Physician Assistant

## 2012-12-18 MED ORDER — AMPHETAMINE-DEXTROAMPHET ER 25 MG PO CP24: 25.0000 mg | ORAL_CAPSULE | ORAL | Status: DC

## 2013-02-04 ENCOUNTER — Other Ambulatory Visit: Payer: Self-pay | Admitting: Physician Assistant

## 2013-02-04 MED ORDER — AMPHETAMINE-DEXTROAMPHET ER 25 MG PO CP24: 25.0000 mg | ORAL_CAPSULE | ORAL | Status: DC

## 2013-03-31 ENCOUNTER — Other Ambulatory Visit: Payer: Self-pay | Admitting: Physician Assistant

## 2013-03-31 MED ORDER — AMPHETAMINE-DEXTROAMPHET ER 25 MG PO CP24: 25.0000 mg | ORAL_CAPSULE | ORAL | Status: DC

## 2013-06-04 ENCOUNTER — Other Ambulatory Visit: Payer: Self-pay | Admitting: Physician Assistant

## 2013-06-04 MED ORDER — AMPHETAMINE-DEXTROAMPHET ER 25 MG PO CP24: 25.0000 mg | ORAL_CAPSULE | ORAL | Status: DC

## 2013-07-24 ENCOUNTER — Other Ambulatory Visit: Payer: Self-pay | Admitting: Physician Assistant

## 2013-07-24 MED ORDER — AMPHETAMINE-DEXTROAMPHET ER 25 MG PO CP24: 25.0000 mg | ORAL_CAPSULE | ORAL | Status: DC

## 2013-07-28 ENCOUNTER — Other Ambulatory Visit: Payer: Self-pay | Admitting: Physician Assistant

## 2013-08-12 ENCOUNTER — Encounter: Payer: Self-pay | Admitting: Physician Assistant

## 2013-08-13 ENCOUNTER — Encounter: Payer: Self-pay | Admitting: Physician Assistant

## 2013-10-11 DIAGNOSIS — F419 Anxiety disorder, unspecified: Secondary | ICD-10-CM | POA: Insufficient documentation

## 2013-10-11 DIAGNOSIS — F32A Depression, unspecified: Secondary | ICD-10-CM | POA: Insufficient documentation

## 2013-10-11 DIAGNOSIS — K589 Irritable bowel syndrome without diarrhea: Secondary | ICD-10-CM | POA: Insufficient documentation

## 2013-10-11 DIAGNOSIS — F329 Major depressive disorder, single episode, unspecified: Secondary | ICD-10-CM | POA: Insufficient documentation

## 2013-10-11 DIAGNOSIS — T7840XA Allergy, unspecified, initial encounter: Secondary | ICD-10-CM | POA: Insufficient documentation

## 2013-10-15 ENCOUNTER — Encounter: Payer: Self-pay | Admitting: Physician Assistant

## 2013-10-15 ENCOUNTER — Ambulatory Visit (INDEPENDENT_AMBULATORY_CARE_PROVIDER_SITE_OTHER): Payer: 59 | Admitting: Physician Assistant

## 2013-10-15 VITALS — BP 110/68 | HR 72 | Temp 98.1°F | Resp 16 | Ht 63.5 in | Wt 140.0 lb

## 2013-10-15 DIAGNOSIS — F32A Depression, unspecified: Secondary | ICD-10-CM

## 2013-10-15 DIAGNOSIS — F329 Major depressive disorder, single episode, unspecified: Secondary | ICD-10-CM

## 2013-10-15 DIAGNOSIS — F411 Generalized anxiety disorder: Secondary | ICD-10-CM

## 2013-10-15 DIAGNOSIS — R1013 Epigastric pain: Secondary | ICD-10-CM

## 2013-10-15 DIAGNOSIS — R197 Diarrhea, unspecified: Secondary | ICD-10-CM

## 2013-10-15 DIAGNOSIS — Z720 Tobacco use: Secondary | ICD-10-CM

## 2013-10-15 DIAGNOSIS — Z0001 Encounter for general adult medical examination with abnormal findings: Secondary | ICD-10-CM

## 2013-10-15 DIAGNOSIS — F17200 Nicotine dependence, unspecified, uncomplicated: Secondary | ICD-10-CM

## 2013-10-15 DIAGNOSIS — R6889 Other general symptoms and signs: Secondary | ICD-10-CM

## 2013-10-15 LAB — CBC WITH DIFFERENTIAL/PLATELET
BASOS PCT: 0 % (ref 0–1)
Basophils Absolute: 0 10*3/uL (ref 0.0–0.1)
EOS ABS: 0 10*3/uL (ref 0.0–0.7)
EOS PCT: 0 % (ref 0–5)
HEMATOCRIT: 40.7 % (ref 36.0–46.0)
HEMOGLOBIN: 14.1 g/dL (ref 12.0–15.0)
Lymphocytes Relative: 27 % (ref 12–46)
Lymphs Abs: 2.4 10*3/uL (ref 0.7–4.0)
MCH: 32 pg (ref 26.0–34.0)
MCHC: 34.6 g/dL (ref 30.0–36.0)
MCV: 92.5 fL (ref 78.0–100.0)
MONO ABS: 0.9 10*3/uL (ref 0.1–1.0)
MONOS PCT: 10 % (ref 3–12)
Neutro Abs: 5.6 10*3/uL (ref 1.7–7.7)
Neutrophils Relative %: 63 % (ref 43–77)
Platelets: 334 10*3/uL (ref 150–400)
RBC: 4.4 MIL/uL (ref 3.87–5.11)
RDW: 13.9 % (ref 11.5–15.5)
WBC: 8.9 10*3/uL (ref 4.0–10.5)

## 2013-10-15 MED ORDER — ALPRAZOLAM 0.5 MG PO TABS: 0.5000 mg | ORAL_TABLET | Freq: Three times a day (TID) | ORAL | Status: DC | PRN

## 2013-10-15 NOTE — Progress Notes (Signed)
Complete Physical  Assessment and Plan: Pregnancy induced hypertension- BP normal, continue to monitor  IBS (irritable bowel syndrome)- having diarrhea and some epigastric tenderness- rule out other causes.   Depression- controlled  ADD (attention deficit disorder)- off medications, continue diet/exercise and wellbutrin  Anxiety- continue medications, stress management techniques discussed, increase water, good sleep hygiene discussed, increase exercise, and increase veggies.   Allergy- continue OTC meds  Weight loss, diarrhea, increased sweating, anxiety-? From anxiety but will stop celexa for possible serotonin syndrome, check ESR, labs, follow up 1 month, get CXR Epigastric pain/weight loss- get labs, get [place] AB, follow up 1 month.  Smoking cessation- discussed with patient, understands risk of MI, stroke, cancer, and death.    Discussed med's effects and SE's. Screening labs and tests as requested with regular follow-up as recommended.  HPI 28 y.o. female  presents for a complete physical.  Her blood pressure has been controlled at home, today their BP is BP: 110/68 mmHg She does workout. She denies chest pain, shortness of breath, dizziness.  She was on adderall for ADD but has been off it for 3 months.  She is on celexa and wellbutrin for depression/anxiety. Has xanax as need to take and has been using the past 2 months.   Patient is on Vitamin D supplement.   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   She states that she has been under a lot of stress, she was having marital problems, she stopped soft drinks but has had a decreased appetite. She also runs for stress. She is seeing her church for counseling. She is slightly concerned about her weight loss. She has had some diarrhea, increase sweating. She has had some decreased sleep due to racing thoughts.    Wt Readings from Last 3 Encounters:  10/15/13 140 lb (63.504 kg)  10/07/10 174 lb 8 oz  (79.153 kg)   Current Medications:  Current Outpatient Prescriptions on File Prior to Visit  Medication Sig Dispense Refill  . buPROPion (WELLBUTRIN XL) 150 MG 24 hr tablet TAKE 1 TABLET BY MOUTH EVERY DAY  30 tablet  9  . citalopram (CELEXA) 20 MG tablet TAKE 1 TABLET BY MOUTH EVERY DAY  90 tablet  1   No current facility-administered medications on file prior to visit.   Health Maintenance:   Immunization History  Administered Date(s) Administered  . Tdap 06/02/2011   Tetanus:2013 Pneumovax: Flu vaccine: at work Zostavax: Pap yearly with OB/GYN No history of abnormal pap LMP: Current MGM: DEXA: Colonoscopy: EGD:  Allergies:  Allergies  Allergen Reactions  . Sulfa Antibiotics Anaphylaxis   Medical History:  Past Medical History  Diagnosis Date  . No pertinent past medical history   . PONV (postoperative nausea and vomiting)   . Pregnancy induced hypertension   . IBS (irritable bowel syndrome)   . Depression   . ADD (attention deficit disorder)   . Anxiety   . Allergy    Surgical History:  Past Surgical History  Procedure Laterality Date  . Anterior cruciate ligament repair     Family History:  Family History  Problem Relation Age of Onset  . Hypertension Mother   . Hypertension Father   . Anesthesia problems Neg Hx   . Hypotension Neg Hx   . Malignant hyperthermia Neg Hx   . Pseudochol deficiency Neg Hx    Social History:  History  Substance Use Topics  . Smoking status: Current Every Day  Smoker  . Smokeless tobacco: Never Used  . Alcohol Use: No     Review of Systems: see HPI  Physical Exam: Estimated body mass index is 24.41 kg/(m^2) as calculated from the following:   Height as of this encounter: 5' 3.5" (1.613 m).   Weight as of this encounter: 140 lb (63.504 kg). BP 110/68  Pulse 72  Temp(Src) 98.1 F (36.7 C)  Resp 16  Ht 5' 3.5" (1.613 m)  Wt 140 lb (63.504 kg)  BMI 24.41 kg/m2  LMP 10/08/2013  Breastfeeding? No General  Appearance: Well nourished, in no apparent distress. Eyes: PERRLA, EOMs, conjunctiva no swelling or erythema, normal fundi and vessels. Sinuses: No Frontal/maxillary tenderness ENT/Mouth: Ext aud canals clear, normal light reflex with TMs without erythema, bulging.  Good dentition. No erythema, swelling, or exudate on post pharynx. Tonsils not swollen or erythematous. Hearing normal.  Neck: Supple, thyroid normal. No bruits Respiratory: Respiratory effort normal, BS equal bilaterally without rales, rhonchi, wheezing or stridor. Cardio: RRR without murmurs, rubs or gallops. Brisk peripheral pulses without edema.  Chest: symmetric, with normal excursions and percussion. Breasts: Symmetric, without lumps, nipple discharge, retractions. Abdomen: Soft, + + epigastric tender, no guarding, rebound, hernias, masses, or organomegaly. .  Lymphatics: Non tender without lymphadenopathy.  Genitourinary: defer Musculoskeletal: Full ROM all peripheral extremities,5/5 strength, and normal gait. Skin: Warm, dry without rashes, lesions, ecchymosis. 2x2 dark mid lower back, left side irreg 3x4. 1x1 left shoulder Neuro: Cranial nerves intact, reflexes equal bilaterally. Normal muscle tone, no cerebellar symptoms. Sensation intact.  Psych: Awake and oriented X 3, normal affect, Insight and Judgment appropriate.   EKG: WNL no changes.   Vicie Mutters 3:25 PM

## 2013-10-15 NOTE — Patient Instructions (Signed)
Stop the celexa Take the xanax as needed  Stress and Stress Management Stress is a normal reaction to life events. It is what you feel when life demands more than you are used to or more than you can handle. Some stress can be useful. For example, the stress reaction can help you catch the last bus of the day, study for a test, or meet a deadline at work. But stress that occurs too often or for too long can cause problems. It can affect your emotional health and interfere with relationships and normal daily activities. Too much stress can weaken your immune system and increase your risk for physical illness. If you already have a medical problem, stress can make it worse. CAUSES  All sorts of life events may cause stress. An event that causes stress for one person may not be stressful for another person. Major life events commonly cause stress. These may be positive or negative. Examples include losing your job, moving into a new home, getting married, having a baby, or losing a loved one. Less obvious life events may also cause stress, especially if they occur day after day or in combination. Examples include working long hours, driving in traffic, caring for children, being in debt, or being in a difficult relationship. SIGNS AND SYMPTOMS Stress may cause emotional symptoms including, the following:  Anxiety. This is feeling worried, afraid, on edge, overwhelmed, or out of control.  Anger. This is feeling irritated or impatient.  Depression. This is feeling sad, down, helpless, or guilty.  Difficulty focusing, remembering, or making decisions. Stress may cause physical symptoms, including the following:   Aches and pains. These may affect your head, neck, back, stomach, or other areas of your body.  Tight muscles or clenched jaw.  Low energy or trouble sleeping. Stress may cause unhealthy behaviors, including the following:   Eating to feel better (overeating) or skipping  meals.  Sleeping too little, too much, or both.  Working too much or putting off tasks (procrastination).  Smoking, drinking alcohol, or using drugs to feel better. DIAGNOSIS  Stress is diagnosed through an assessment by your health care provider. Your health care provider will ask questions about your symptoms and any stressful life events.Your health care provider will also ask about your medical history and may order blood tests or other tests. Certain medical conditions and medicine can cause physical symptoms similar to stress. Mental illness can cause emotional symptoms and unhealthy behaviors similar to stress. Your health care provider may refer you to a mental health professional for further evaluation.  TREATMENT  Stress management is the recommended treatment for stress.The goals of stress management are reducing stressful life events and coping with stress in healthy ways.  Techniques for reducing stressful life events include the following:  Stress identification. Self-monitor for stress and identify what causes stress for you. These skills may help you to avoid some stressful events.  Time management. Set your priorities, keep a calendar of events, and learn to say "no." These tools can help you avoid making too many commitments. Techniques for coping with stress include the following:  Rethinking the problem. Try to think realistically about stressful events rather than ignoring them or overreacting. Try to find the positives in a stressful situation rather than focusing on the negatives.  Exercise. Physical exercise can release both physical and emotional tension. The key is to find a form of exercise you enjoy and do it regularly.  Relaxation techniques. These relax the body and  mind. Examples include yoga, meditation, tai chi, biofeedback, deep breathing, progressive muscle relaxation, listening to music, being out in nature, journaling, and other hobbies. Again, the key is  to find one or more that you enjoy and can do regularly.  Healthy lifestyle. Eat a balanced diet, get plenty of sleep, and do not smoke. Avoid using alcohol or drugs to relax.  Strong support network. Spend time with family, friends, or other people you enjoy being around.Express your feelings and talk things over with someone you trust. Counseling or talktherapy with a mental health professional may be helpful if you are having difficulty managing stress on your own. Medicine is typically not recommended for the treatment of stress.Talk to your health care provider if you think you need medicine for symptoms of stress. HOME CARE INSTRUCTIONS  Keep all follow-up visits as directed by your health care provider.  Take all medicines as directed by your health care provider. SEEK MEDICAL CARE IF:  Your symptoms get worse or you start having new symptoms.  You feel overwhelmed by your problems and can no longer manage them on your own. SEEK IMMEDIATE MEDICAL CARE IF:  You feel like hurting yourself or someone else. Document Released: 06/13/2000 Document Revised: 05/04/2013 Document Reviewed: 08/12/2012 [facility] Patient Information 2015 [place], [place]. This information is not intended to replace advice given to you by your health care provider. Make sure you discuss any questions you have with your health care provider.

## 2013-10-16 LAB — URINALYSIS, ROUTINE W REFLEX MICROSCOPIC
Bilirubin Urine: NEGATIVE
Glucose, UA: NEGATIVE mg/dL
Hgb urine dipstick: NEGATIVE
Ketones, ur: 15 mg/dL — AB
LEUKOCYTES UA: NEGATIVE
NITRITE: NEGATIVE
PH: 6 (ref 5.0–8.0)
Protein, ur: NEGATIVE mg/dL
SPECIFIC GRAVITY, URINE: 1.016 (ref 1.005–1.030)
UROBILINOGEN UA: 0.2 mg/dL (ref 0.0–1.0)

## 2013-10-16 LAB — LIPID PANEL
CHOLESTEROL: 141 mg/dL (ref 0–200)
HDL: 44 mg/dL (ref >39)
LDL CALC: 72 mg/dL (ref 0–99)
TRIGLYCERIDES: 125 mg/dL (ref <150)
Total CHOL/HDL Ratio: 3.2 Ratio
VLDL: 25 mg/dL (ref 0–40)

## 2013-10-16 LAB — TSH: TSH: 0.982 u[IU]/mL (ref 0.350–4.500)

## 2013-10-16 LAB — HEPATIC FUNCTION PANEL
AST: 9 U/L (ref 0–37)
Albumin: 5 g/dL (ref 3.5–5.2)
Alkaline Phosphatase: 61 U/L (ref 39–117)
BILIRUBIN INDIRECT: 0.3 mg/dL (ref 0.2–1.2)
Bilirubin, Direct: 0.1 mg/dL (ref 0.0–0.3)
TOTAL PROTEIN: 7.7 g/dL (ref 6.0–8.3)
Total Bilirubin: 0.4 mg/dL (ref 0.2–1.2)

## 2013-10-16 LAB — BASIC METABOLIC PANEL WITH GFR
BUN: 11 mg/dL (ref 6–23)
CALCIUM: 10 mg/dL (ref 8.4–10.5)
CO2: 26 mEq/L (ref 19–32)
Chloride: 101 mEq/L (ref 96–112)
Creat: 0.64 mg/dL (ref 0.50–1.10)
GFR, Est Non African American: >89 mL/min
GLUCOSE: 74 mg/dL (ref 70–99)
Potassium: 3.6 mEq/L (ref 3.5–5.3)
Sodium: 137 mEq/L (ref 135–145)

## 2013-10-16 LAB — HEMOGLOBIN A1C
Hgb A1c MFr Bld: 5.1 % (ref <5.7)
Mean Plasma Glucose: 100 mg/dL (ref <117)

## 2013-10-16 LAB — IRON AND TIBC
%SAT: 16 % — AB (ref 20–55)
Iron: 73 ug/dL (ref 42–145)
TIBC: 463 ug/dL (ref 250–470)
UIBC: 390 ug/dL (ref 125–400)

## 2013-10-16 LAB — VITAMIN D 25 HYDROXY (VIT D DEFICIENCY, FRACTURES): VIT D 25 HYDROXY: 51 ng/mL (ref 30–89)

## 2013-10-16 LAB — MICROALBUMIN / CREATININE URINE RATIO
CREATININE, URINE: 142.5 mg/dL
MICROALB UR: 2.4 mg/dL — AB (ref <2.0)
MICROALB/CREAT RATIO: 16.8 mg/g (ref 0.0–30.0)

## 2013-10-16 LAB — AMYLASE: AMYLASE: 24 U/L (ref 0–105)

## 2013-10-16 LAB — HELICOBACTER PYLORI ABS-IGG+IGA, BLD
H PYLORI IGG: 0.44 {ISR}
HELICOBACTER PYLORI AB, IGA: 1.4 U/mL (ref <9.0)

## 2013-10-16 LAB — FERRITIN: FERRITIN: 12 ng/mL (ref 10–291)

## 2013-10-16 LAB — MAGNESIUM: Magnesium: 1.9 mg/dL (ref 1.5–2.5)

## 2013-10-16 LAB — INSULIN, FASTING: INSULIN FASTING, SERUM: 3.3 u[IU]/mL (ref 2.0–19.6)

## 2013-10-16 LAB — SEDIMENTATION RATE: SED RATE: 1 mm/h (ref 0–22)

## 2013-10-16 LAB — VITAMIN B12: Vitamin B-12: 326 pg/mL (ref 211–911)

## 2013-10-30 ENCOUNTER — Other Ambulatory Visit: Payer: Self-pay | Admitting: Physician Assistant

## 2013-11-02 ENCOUNTER — Encounter: Payer: Self-pay | Admitting: Physician Assistant

## 2014-02-03 ENCOUNTER — Encounter: Payer: Self-pay | Admitting: Physician Assistant

## 2014-02-03 ENCOUNTER — Ambulatory Visit (INDEPENDENT_AMBULATORY_CARE_PROVIDER_SITE_OTHER): Payer: 59 | Admitting: Physician Assistant

## 2014-02-03 VITALS — BP 102/64 | HR 72 | Temp 98.6°F | Resp 16 | Ht 63.5 in | Wt 132.0 lb

## 2014-02-03 DIAGNOSIS — F419 Anxiety disorder, unspecified: Secondary | ICD-10-CM

## 2014-02-03 MED ORDER — SERTRALINE HCL 50 MG PO TABS: 50.0000 mg | ORAL_TABLET | Freq: Every day | ORAL | Status: DC

## 2014-02-03 MED ORDER — ALPRAZOLAM 0.5 MG PO TABS: ORAL_TABLET | ORAL | Status: DC

## 2014-02-03 NOTE — Patient Instructions (Signed)
Do 1/2 tablet of zoloft for 2-3 days and then stop the wellbutrin and go up to full pill.  Call the office if you have any issues.   Follow up 1 month  Stress and Stress Management Stress is a normal reaction to life events. It is what you feel when life demands more than you are used to or more than you can handle. Some stress can be useful. For example, the stress reaction can help you catch the last bus of the day, study for a test, or meet a deadline at work. But stress that occurs too often or for too long can cause problems. It can affect your emotional health and interfere with relationships and normal daily activities. Too much stress can weaken your immune system and increase your risk for physical illness. If you already have a medical problem, stress can make it worse. CAUSES  All sorts of life events may cause stress. An event that causes stress for one person may not be stressful for another person. Major life events commonly cause stress. These may be positive or negative. Examples include losing your job, moving into a new home, getting married, having a baby, or losing a loved one. Less obvious life events may also cause stress, especially if they occur day after day or in combination. Examples include working long hours, driving in traffic, caring for children, being in debt, or being in a difficult relationship. SIGNS AND SYMPTOMS Stress may cause emotional symptoms including, the following:  Anxiety. This is feeling worried, afraid, on edge, overwhelmed, or out of control.  Anger. This is feeling irritated or impatient.  Depression. This is feeling sad, down, helpless, or guilty.  Difficulty focusing, remembering, or making decisions. Stress may cause physical symptoms, including the following:   Aches and pains. These may affect your head, neck, back, stomach, or other areas of your body.  Tight muscles or clenched jaw.  Low energy or trouble sleeping. Stress may cause  unhealthy behaviors, including the following:   Eating to feel better (overeating) or skipping meals.  Sleeping too little, too much, or both.  Working too much or putting off tasks (procrastination).  Smoking, drinking alcohol, or using drugs to feel better. DIAGNOSIS  Stress is diagnosed through an assessment by your health care provider. Your health care provider will ask questions about your symptoms and any stressful life events.Your health care provider will also ask about your medical history and may order blood tests or other tests. Certain medical conditions and medicine can cause physical symptoms similar to stress. Mental illness can cause emotional symptoms and unhealthy behaviors similar to stress. Your health care provider may refer you to a mental health professional for further evaluation.  TREATMENT  Stress management is the recommended treatment for stress.The goals of stress management are reducing stressful life events and coping with stress in healthy ways.  Techniques for reducing stressful life events include the following:  Stress identification. Self-monitor for stress and identify what causes stress for you. These skills may help you to avoid some stressful events.  Time management. Set your priorities, keep a calendar of events, and learn to say "no." These tools can help you avoid making too many commitments. Techniques for coping with stress include the following:  Rethinking the problem. Try to think realistically about stressful events rather than ignoring them or overreacting. Try to find the positives in a stressful situation rather than focusing on the negatives.  Exercise. Physical exercise can release both physical  and emotional tension. The key is to find a form of exercise you enjoy and do it regularly.  Relaxation techniques. These relax the body and mind. Examples include yoga, meditation, tai chi, biofeedback, deep breathing, progressive muscle  relaxation, listening to music, being out in nature, journaling, and other hobbies. Again, the key is to find one or more that you enjoy and can do regularly.  Healthy lifestyle. Eat a balanced diet, get plenty of sleep, and do not smoke. Avoid using alcohol or drugs to relax.  Strong support network. Spend time with family, friends, or other people you enjoy being around.Express your feelings and talk things over with someone you trust. Counseling or talktherapy with a mental health professional may be helpful if you are having difficulty managing stress on your own. Medicine is typically not recommended for the treatment of stress.Talk to your health care provider if you think you need medicine for symptoms of stress. HOME CARE INSTRUCTIONS  Keep all follow-up visits as directed by your health care provider.  Take all medicines as directed by your health care provider. SEEK MEDICAL CARE IF:  Your symptoms get worse or you start having new symptoms.  You feel overwhelmed by your problems and can no longer manage them on your own. SEEK IMMEDIATE MEDICAL CARE IF:  You feel like hurting yourself or someone else. Document Released: 06/13/2000 Document Revised: 05/04/2013 Document Reviewed: 08/12/2012 [facility] Patient Information 2015 [place], [place]. This information is not intended to replace advice given to you by your health care provider. Make sure you discuss any questions you have with your health care provider.

## 2014-02-03 NOTE — Progress Notes (Signed)
Assessment and Plan: Anxiety- will try zoloft 50mg  start 1/2 for 2-3 days then 1 pill, stop wellbutrin, if this does not help will try lexapro or effexor.  Continue xanax PRN.   Follow up 1 month  HPI 29 y.o.female presents for follow up for anxiety. She states that she is seeing Mackenzie Curry, and is on medication for anxiety/depression. She is having marital problems still and her husband works with a girl that he is still in communication with. She continues to have anxiety, states that xanax helps some but is temporary. She is off the celexa for possible serotonin syndrome and her diarrhea has resolved. She is still on wellbutrin and feels it is not helping. She states she stays anxious, she has decreased appetite, no problem with sleeping, not effecting work/performance. She has been on lexapro/effexor in the past, but did not do well with it in the past.   Past Medical History  Diagnosis Date  . No pertinent past medical history   . PONV (postoperative nausea and vomiting)   . Pregnancy induced hypertension   . IBS (irritable bowel syndrome)   . Depression   . ADD (attention deficit disorder)   . Anxiety   . Allergy      Allergies  Allergen Reactions  . Sulfa Antibiotics Anaphylaxis      Current Outpatient Prescriptions on File Prior to Visit  Medication Sig Dispense Refill  . ALPRAZolam (XANAX) 0.5 MG tablet TAKE 1/2 TO 1 TABLET EVERY DAY AS NEEDED 30 tablet 0  . buPROPion (WELLBUTRIN XL) 150 MG 24 hr tablet TAKE 1 TABLET BY MOUTH EVERY DAY 30 tablet 9  . citalopram (CELEXA) 20 MG tablet TAKE 1 TABLET BY MOUTH EVERY DAY 90 tablet 1   No current facility-administered medications on file prior to visit.    ROS: all negative except above.   Physical Exam: Filed Weights   02/03/14 1126  Weight: 132 lb (59.875 kg)   BP 102/64 mmHg  Pulse 72  Temp(Src) 98.6 F (37 C)  Resp 16  Ht 5' 3.5" (1.613 m)  Wt 132 lb (59.875 kg)  BMI 23.01 kg/m2 General Appearance: Well  nourished, in no apparent distress. Eyes: PERRLA, EOMs, conjunctiva no swelling or erythema Sinuses: No Frontal/maxillary tenderness ENT/Mouth: Ext aud canals clear, TMs without erythema, bulging. No erythema, swelling, or exudate on post pharynx.  Tonsils not swollen or erythematous. Hearing normal.  Neck: Supple, thyroid normal.  Respiratory: Respiratory effort normal, BS equal bilaterally without rales, rhonchi, wheezing or stridor.  Cardio: RRR with no MRGs. Brisk peripheral pulses without edema.  Abdomen: Soft, + BS.  Non tender, no guarding, rebound, hernias, masses. Lymphatics: Non tender without lymphadenopathy.  Musculoskeletal: Full ROM, 5/5 strength, normal gait.  Skin: Warm, dry without rashes, lesions, ecchymosis.  Neuro: Cranial nerves intact. Normal muscle tone, no cerebellar symptoms. Sensation intact.  Psych: Awake and oriented X 3, normal affect, Insight and Judgment appropriate.     Vicie Mutters, PA-C 11:52 AM [facility]

## 2014-03-30 ENCOUNTER — Other Ambulatory Visit: Payer: Self-pay | Admitting: Physician Assistant

## 2014-08-20 ENCOUNTER — Other Ambulatory Visit: Payer: Self-pay | Admitting: Physician Assistant

## 2014-08-23 NOTE — Telephone Encounter (Signed)
Called Rx into [facility] 

## 2014-10-20 ENCOUNTER — Encounter: Payer: Self-pay | Admitting: Physician Assistant

## 2014-11-01 ENCOUNTER — Encounter: Payer: Self-pay | Admitting: Physician Assistant

## 2014-12-22 ENCOUNTER — Encounter: Payer: Self-pay | Admitting: Physician Assistant

## 2014-12-22 ENCOUNTER — Ambulatory Visit (INDEPENDENT_AMBULATORY_CARE_PROVIDER_SITE_OTHER): Payer: 59 | Admitting: Physician Assistant

## 2014-12-22 VITALS — BP 90/60 | HR 99 | Temp 97.9°F | Resp 16 | Ht 63.0 in | Wt 130.0 lb

## 2014-12-22 DIAGNOSIS — F32A Depression, unspecified: Secondary | ICD-10-CM

## 2014-12-22 DIAGNOSIS — Z131 Encounter for screening for diabetes mellitus: Secondary | ICD-10-CM

## 2014-12-22 DIAGNOSIS — Z13220 Encounter for screening for lipoid disorders: Secondary | ICD-10-CM

## 2014-12-22 DIAGNOSIS — E559 Vitamin D deficiency, unspecified: Secondary | ICD-10-CM

## 2014-12-22 DIAGNOSIS — Z0000 Encounter for general adult medical examination without abnormal findings: Secondary | ICD-10-CM | POA: Diagnosis not present

## 2014-12-22 DIAGNOSIS — K589 Irritable bowel syndrome without diarrhea: Secondary | ICD-10-CM

## 2014-12-22 DIAGNOSIS — Z87891 Personal history of nicotine dependence: Secondary | ICD-10-CM | POA: Insufficient documentation

## 2014-12-22 DIAGNOSIS — Z1389 Encounter for screening for other disorder: Secondary | ICD-10-CM

## 2014-12-22 DIAGNOSIS — Z79899 Other long term (current) drug therapy: Secondary | ICD-10-CM

## 2014-12-22 DIAGNOSIS — F329 Major depressive disorder, single episode, unspecified: Secondary | ICD-10-CM

## 2014-12-22 DIAGNOSIS — T7840XA Allergy, unspecified, initial encounter: Secondary | ICD-10-CM

## 2014-12-22 DIAGNOSIS — F419 Anxiety disorder, unspecified: Secondary | ICD-10-CM

## 2014-12-22 DIAGNOSIS — F17200 Nicotine dependence, unspecified, uncomplicated: Secondary | ICD-10-CM

## 2014-12-22 DIAGNOSIS — R5383 Other fatigue: Secondary | ICD-10-CM

## 2014-12-22 LAB — CBC WITH DIFFERENTIAL/PLATELET
BASOS ABS: 0 10*3/uL (ref 0.0–0.1)
BASOS PCT: 0 % (ref 0–1)
Eosinophils Absolute: 0.1 10*3/uL (ref 0.0–0.7)
Eosinophils Relative: 1 % (ref 0–5)
HEMATOCRIT: 39.9 % (ref 36.0–46.0)
HEMOGLOBIN: 14.1 g/dL (ref 12.0–15.0)
LYMPHS PCT: 21 % (ref 12–46)
Lymphs Abs: 1.8 10*3/uL (ref 0.7–4.0)
MCH: 32.8 pg (ref 26.0–34.0)
MCHC: 35.3 g/dL (ref 30.0–36.0)
MCV: 92.8 fL (ref 78.0–100.0)
MONO ABS: 0.6 10*3/uL (ref 0.1–1.0)
MPV: 10.3 fL (ref 8.6–12.4)
Monocytes Relative: 7 % (ref 3–12)
NEUTROS ABS: 6.2 10*3/uL (ref 1.7–7.7)
Neutrophils Relative %: 71 % (ref 43–77)
Platelets: 319 10*3/uL (ref 150–400)
RBC: 4.3 MIL/uL (ref 3.87–5.11)
RDW: 13.1 % (ref 11.5–15.5)
WBC: 8.7 10*3/uL (ref 4.0–10.5)

## 2014-12-22 LAB — URINALYSIS, ROUTINE W REFLEX MICROSCOPIC

## 2014-12-22 LAB — HEMOGLOBIN A1C
Hgb A1c MFr Bld: 5.1 % (ref <5.7)
Mean Plasma Glucose: 100 mg/dL (ref <117)

## 2014-12-22 MED ORDER — BUPROPION HCL ER (XL) 150 MG PO TB24: 150.0000 mg | ORAL_TABLET | ORAL | Status: DC

## 2014-12-22 MED ORDER — ALPRAZOLAM 0.5 MG PO TABS: ORAL_TABLET | ORAL | Status: DC

## 2014-12-22 NOTE — Patient Instructions (Signed)
Call OB/GYN Call [facility] in 1 month  Preventive Care for Adults A healthy lifestyle and preventive care can promote health and wellness. Preventive health guidelines for women include the following key practices.  A routine yearly physical is a good way to check with your health care provider about your health and preventive screening. It is a chance to share any concerns and updates on your health and to receive a thorough exam.  Visit your dentist for a routine exam and preventive care every 6 months. Brush your teeth twice a day and floss once a day. Good oral hygiene prevents tooth decay and gum disease.  The frequency of eye exams is based on your age, health, family medical history, use of contact lenses, and other factors. Follow your health care provider's recommendations for frequency of eye exams.  Eat a healthy diet. Foods like vegetables, fruits, whole grains, low-fat dairy products, and lean protein foods contain the nutrients you need without too many calories. Decrease your intake of foods high in solid fats, added sugars, and salt. Eat the right amount of calories for you.Get information about a proper diet from your health care provider, if necessary.  Regular physical exercise is one of the most important things you can do for your health. Most adults should get at least 150 minutes of moderate-intensity exercise (any activity that increases your heart rate and causes you to sweat) each week. In addition, most adults need muscle-strengthening exercises on 2 or more days a week.  Maintain a healthy weight. The body mass index (BMI) is a screening tool to identify possible weight problems. It provides an estimate of body fat based on height and weight. Your health care provider can find your BMI and can help you achieve or maintain a healthy weight.For adults 20 years and older:  A BMI below 18.5 is considered underweight.  A BMI of 18.5 to 24.9 is normal.  A BMI of 25 to 29.9  is considered overweight.  A BMI of 30 and above is considered obese.  Maintain normal blood lipids and cholesterol levels by exercising and minimizing your intake of saturated fat. Eat a balanced diet with plenty of fruit and vegetables. Blood tests for lipids and cholesterol should begin at age 78 and be repeated every 5 years. If your lipid or cholesterol levels are high, you are over 50, or you are at high risk for heart disease, you may need your cholesterol levels checked more frequently.Ongoing high lipid and cholesterol levels should be treated with medicines if diet and exercise are not working.  If you smoke, find out from your health care provider how to quit. If you do not use tobacco, do not start.  Lung cancer screening is recommended for adults aged 47-80 years who are at high risk for developing lung cancer because of a history of smoking. A yearly low-dose CT scan of the lungs is recommended for people who have at least a 30-pack-year history of smoking and are a current smoker or have quit within the past 15 years. A pack year of smoking is smoking an average of 1 pack of cigarettes a day for 1 year (for example: 1 pack a day for 30 years or 2 packs a day for 15 years). Yearly screening should continue until the smoker has stopped smoking for at least 15 years. Yearly screening should be stopped for people who develop a health problem that would prevent them from having lung cancer treatment.  If you are pregnant,  drink alcohol. If you are breastfeeding, be very cautious about drinking alcohol. If you are not pregnant and choose to drink alcohol, do not have more than 1 drink per day. One drink is considered to be 12 ounces (355 mL) of beer, 5 ounces (148 mL) of wine, or 1.5 ounces (44 mL) of liquor.  Avoid use of street drugs. Do not share needles with anyone. Ask for help if you need support or instructions about stopping the use of drugs.  High blood pressure causes heart  disease and increases the risk of stroke. Your blood pressure should be checked at least every 1 to 2 years. Ongoing high blood pressure should be treated with medicines if weight loss and exercise do not work.  If you are 55-79 years old, ask your health care provider if you should take aspirin to prevent strokes.  Diabetes screening involves taking a blood sample to check your fasting blood sugar level. This should be done once every 3 years, after age 45, if you are within normal weight and without risk factors for diabetes. Testing should be considered at a younger age or be carried out more frequently if you are overweight and have at least 1 risk factor for diabetes.  Breast cancer screening is essential preventive care for women. You should practice "breast self-awareness." This means understanding the normal appearance and feel of your breasts and may include breast self-examination. Any changes detected, no matter how small, should be reported to a health care provider. Women in their 20s and 30s should have a clinical breast exam (CBE) by a health care provider as part of a regular health exam every 1 to 3 years. After age 40, women should have a CBE every year. Starting at age 40, women should consider having a mammogram (breast X-ray test) every year. Women who have a family history of breast cancer should talk to their health care provider about genetic screening. Women at a high risk of breast cancer should talk to their health care providers about having an MRI and a mammogram every year.  Breast cancer gene (BRCA)-related cancer risk assessment is recommended for women who have family members with BRCA-related cancers. BRCA-related cancers include breast, ovarian, tubal, and peritoneal cancers. Having family members with these cancers may be associated with an increased risk for harmful changes (mutations) in the breast cancer genes BRCA1 and BRCA2. Results of the assessment will determine  the need for genetic counseling and BRCA1 and BRCA2 testing.  Routine pelvic exams to screen for cancer are no longer recommended for nonpregnant women who are considered low risk for cancer of the pelvic organs (ovaries, uterus, and vagina) and who do not have symptoms. Ask your health care provider if a screening pelvic exam is right for you.  If you have had past treatment for cervical cancer or a condition that could lead to cancer, you need Pap tests and screening for cancer for at least 20 years after your treatment. If Pap tests have been discontinued, your risk factors (such as having a new sexual partner) need to be reassessed to determine if screening should be resumed. Some women have medical problems that increase the chance of getting cervical cancer. In these cases, your health care provider may recommend more frequent screening and Pap tests.  The HPV test is an additional test that may be used for cervical cancer screening. The HPV test looks for the virus that can cause the cell changes on the cervix. The cells collected   cells collected during the Pap test can be tested for HPV. The HPV test could be used to screen women aged 36 years and older, and should be used in women of any age who have unclear Pap test results. After the age of 52, women should have HPV testing at the same frequency as a Pap test.  Colorectal cancer can be detected and often prevented. Most routine colorectal cancer screening begins at the age of 28 years and continues through age 37 years. However, your health care provider may recommend screening at an earlier age if you have risk factors for colon cancer. On a yearly basis, your health care provider may provide home test kits to check for hidden blood in the stool. Use of a small camera at the end of a tube, to directly examine the colon (sigmoidoscopy or colonoscopy), can detect the earliest forms of colorectal cancer. Talk to your health care provider about this at age 13, when  routine screening begins. Direct exam of the colon should be repeated every 5-10 years through age 29 years, unless early forms of pre-cancerous polyps or small growths are found.  People who are at an increased risk for hepatitis B should be screened for this virus. You are considered at high risk for hepatitis B if:  You were born in a country where hepatitis B occurs often. Talk with your health care provider about which countries are considered high risk.  Your parents were born in a high-risk country and you have not received a shot to protect against hepatitis B (hepatitis B vaccine).  You have HIV or AIDS.  You use needles to inject street drugs.  You live with, or have sex with, someone who has hepatitis B.  You get hemodialysis treatment.  You take certain medicines for conditions like cancer, organ transplantation, and autoimmune conditions.  Hepatitis C blood testing is recommended for all people born from [birth year] through 1965 and any individual with known risks for hepatitis C.  Practice safe sex. Use condoms and avoid high-risk sexual practices to reduce the spread of sexually transmitted infections (STIs). STIs include gonorrhea, chlamydia, syphilis, trichomonas, herpes, HPV, and human immunodeficiency virus (HIV). Herpes, HIV, and HPV are viral illnesses that have no cure. They can result in disability, cancer, and death.  You should be screened for sexually transmitted illnesses (STIs) including gonorrhea and chlamydia if:  You are sexually active and are younger than 24 years.  You are older than 24 years and your health care provider tells you that you are at risk for this type of infection.  Your sexual activity has changed since you were last screened and you are at an increased risk for chlamydia or gonorrhea. Ask your health care provider if you are at risk.  If you are at risk of being infected with HIV, it is recommended that you take a prescription medicine daily  to prevent HIV infection. This is called preexposure prophylaxis (PrEP). You are considered at risk if:  You are a heterosexual woman, are sexually active, and are at increased risk for HIV infection.  You take drugs by injection.  You are sexually active with a partner who has HIV.  Talk with your health care provider about whether you are at high risk of being infected with HIV. If you choose to begin PrEP, you should first be tested for HIV. You should then be tested every 3 months for as long as you are taking PrEP.  Osteoporosis is a disease  in which the bones lose minerals and strength with aging. This can result in serious bone fractures or breaks. The risk of osteoporosis can be identified using a bone density scan. Women ages 75 years and over and women at risk for fractures or osteoporosis should discuss screening with their health care providers. Ask your health care provider whether you should take a calcium supplement or vitamin D to reduce the rate of osteoporosis.  Menopause can be associated with physical symptoms and risks. Hormone replacement therapy is available to decrease symptoms and risks. You should talk to your health care provider about whether hormone replacement therapy is right for you.  Use sunscreen. Apply sunscreen liberally and repeatedly throughout the day. You should seek shade when your shadow is shorter than you. Protect yourself by wearing long sleeves, pants, a wide-brimmed hat, and sunglasses year round, whenever you are outdoors.  Once a month, do a whole body skin exam, using a mirror to look at the skin on your back. Tell your health care provider of new moles, moles that have irregular borders, moles that are larger than a pencil eraser, or moles that have changed in shape or color.  Stay current with required vaccines (immunizations).  Influenza vaccine. All adults should be immunized every year.  Tetanus, diphtheria, and acellular pertussis (Td,  Tdap) vaccine. Pregnant women should receive 1 dose of Tdap vaccine during each pregnancy. The dose should be obtained regardless of the length of time since the last dose. Immunization is preferred during the 27th-36th week of gestation. An adult who has not previously received Tdap or who does not know her vaccine status should receive 1 dose of Tdap. This initial dose should be followed by tetanus and diphtheria toxoids (Td) booster doses every 10 years. Adults with an unknown or incomplete history of completing a 3-dose immunization series with Td-containing vaccines should begin or complete a primary immunization series including a Tdap dose. Adults should receive a Td booster every 10 years.  Varicella vaccine. An adult without evidence of immunity to varicella should receive 2 doses or a second dose if she has previously received 1 dose. Pregnant females who do not have evidence of immunity should receive the first dose after pregnancy. This first dose should be obtained before leaving the health care facility. The second dose should be obtained 4-8 weeks after the first dose.  Human papillomavirus (HPV) vaccine. Females aged 13-26 years who have not received the vaccine previously should obtain the 3-dose series. The vaccine is not recommended for use in pregnant females. However, pregnancy testing is not needed before receiving a dose. If a female is found to be pregnant after receiving a dose, no treatment is needed. In that case, the remaining doses should be delayed until after the pregnancy. Immunization is recommended for any person with an immunocompromised condition through the age of 16 years if she did not get any or all doses earlier. During the 3-dose series, the second dose should be obtained 4-8 weeks after the first dose. The third dose should be obtained 24 weeks after the first dose and 16 weeks after the second dose.  Zoster vaccine. One dose is recommended for adults aged 34 years or  older unless certain conditions are present.  Measles, mumps, and rubella (MMR) vaccine. Adults born before [birth year] generally are considered immune to measles and mumps. Adults born in [birth year] or later should have 1 or more doses of MMR vaccine unless there is a contraindication to the vaccine  or there is laboratory evidence of immunity to each of the three diseases. A routine second dose of MMR vaccine should be obtained at least 28 days after the first dose for students attending postsecondary schools, health care workers, or international travelers. People who received inactivated measles vaccine or an unknown type of measles vaccine during 1963-1967 should receive 2 doses of MMR vaccine. People who received inactivated mumps vaccine or an unknown type of mumps vaccine before 1979 and are at high risk for mumps infection should consider immunization with 2 doses of MMR vaccine. For females of childbearing age, rubella immunity should be determined. If there is no evidence of immunity, females who are not pregnant should be vaccinated. If there is no evidence of immunity, females who are pregnant should delay immunization until after pregnancy. Unvaccinated health care workers born before [birth year] who lack laboratory evidence of measles, mumps, or rubella immunity or laboratory confirmation of disease should consider measles and mumps immunization with 2 doses of MMR vaccine or rubella immunization with 1 dose of MMR vaccine.  Pneumococcal 13-valent conjugate (PCV13) vaccine. When indicated, a person who is uncertain of her immunization history and has no record of immunization should receive the PCV13 vaccine. An adult aged 45 years or older who has certain medical conditions and has not been previously immunized should receive 1 dose of PCV13 vaccine. This PCV13 should be followed with a dose of pneumococcal polysaccharide (PPSV23) vaccine. The PPSV23 vaccine dose should be obtained at least 8 weeks after the dose of  PCV13 vaccine. An adult aged 85 years or older who has certain medical conditions and previously received 1 or more doses of PPSV23 vaccine should receive 1 dose of PCV13. The PCV13 vaccine dose should be obtained 1 or more years after the last PPSV23 vaccine dose.  Pneumococcal polysaccharide (PPSV23) vaccine. When PCV13 is also indicated, PCV13 should be obtained first. All adults aged 4 years and older should be immunized. An adult younger than age 45 years who has certain medical conditions should be immunized. Any person who resides in a nursing home or long-term care facility should be immunized. An adult smoker should be immunized. People with an immunocompromised condition and certain other conditions should receive both PCV13 and PPSV23 vaccines. People with human immunodeficiency virus (HIV) infection should be immunized as soon as possible after diagnosis. Immunization during chemotherapy or radiation therapy should be avoided. Routine use of PPSV23 vaccine is not recommended for American Indians, [place] Natives, or people younger than 65 years unless there are medical conditions that require PPSV23 vaccine. When indicated, people who have unknown immunization and have no record of immunization should receive PPSV23 vaccine. One-time revaccination 5 years after the first dose of PPSV23 is recommended for people aged 19-64 years who have chronic kidney failure, nephrotic syndrome, asplenia, or immunocompromised conditions. People who received 1-2 doses of PPSV23 before age 75 years should receive another dose of PPSV23 vaccine at age 80 years or later if at least 5 years have passed since the previous dose. Doses of PPSV23 are not needed for people immunized with PPSV23 at or after age 98 years.  Meningococcal vaccine. Adults with asplenia or persistent complement component deficiencies should receive 2 doses of quadrivalent meningococcal conjugate (MenACWY-D) vaccine. The doses should be obtained at  least 2 months apart. Microbiologists working with certain meningococcal bacteria, [employer] recruits, people at risk during an outbreak, and people who travel to or live in countries with a high rate of meningitis should be  immunized. A first-year college student up through age 66 years who is living in a residence hall should receive a dose if she did not receive a dose on or after her 16th birthday. Adults who have certain high-risk conditions should receive one or more doses of vaccine.  Hepatitis A vaccine. Adults who wish to be protected from this disease, have certain high-risk conditions, work with hepatitis A-infected animals, work in hepatitis A research labs, or travel to or work in countries with a high rate of hepatitis A should be immunized. Adults who were previously unvaccinated and who anticipate close contact with an international adoptee during the first 60 days after arrival in the [place] from a country with a high rate of hepatitis A should be immunized.  Hepatitis B vaccine. Adults who wish to be protected from this disease, have certain high-risk conditions, may be exposed to blood or other infectious body fluids, are household contacts or sex partners of hepatitis B positive people, are clients or workers in certain care facilities, or travel to or work in countries with a high rate of hepatitis B should be immunized.  Haemophilus influenzae type b (Hib) vaccine. A previously unvaccinated person with asplenia or sickle cell disease or having a scheduled splenectomy should receive 1 dose of Hib vaccine. Regardless of previous immunization, a recipient of a hematopoietic stem cell transplant should receive a 3-dose series 6-12 months after her successful transplant. Hib vaccine is not recommended for adults with HIV infection. Preventive Services / Frequency  Ages 53 to 4 years 1. Blood pressure check. 2. Lipid and cholesterol check. 3. Clinical breast exam.** / Every 3 years  for women in their 42s and 9s. 4. BRCA-related cancer risk assessment.** / For women who have family members with a BRCA-related cancer (breast, ovarian, tubal, or peritoneal cancers). 5. Pap test.** / Every 2 years from ages 33 through 68. Every 3 years starting at age 79 through age 60 or 20 with a history of 3 consecutive normal Pap tests. 6. HPV screening.** / Every 3 years from ages 12 through ages 22 to 24 with a history of 3 consecutive normal Pap tests. 7. Hepatitis C blood test.** / For any individual with known risks for hepatitis C. 8. Skin self-exam. / Monthly. 9. Influenza vaccine. / Every year. 10. Tetanus, diphtheria, and acellular pertussis (Tdap, Td) vaccine.** / Consult your health care provider. Pregnant women should receive 1 dose of Tdap vaccine during each pregnancy. 1 dose of Td every 10 years. 11. Varicella vaccine.** / Consult your health care provider. Pregnant females who do not have evidence of immunity should receive the first dose after pregnancy. 12. HPV vaccine. / 3 doses over 6 months, if 10 and younger. The vaccine is not recommended for use in pregnant females. However, pregnancy testing is not needed before receiving a dose. 13. Measles, mumps, rubella (MMR) vaccine.** / You need at least 1 dose of MMR if you were born in 1957 or later. You may also need a 2nd dose. For females of childbearing age, rubella immunity should be determined. If there is no evidence of immunity, females who are not pregnant should be vaccinated. If there is no evidence of immunity, females who are pregnant should delay immunization until after pregnancy. 14. Pneumococcal 13-valent conjugate (PCV13) vaccine.** / Consult your health care provider. 15. Pneumococcal polysaccharide (PPSV23) vaccine.** / 1 to 2 doses if you smoke cigarettes or if you have certain conditions. 16. Meningococcal vaccine.** / 1 dose if you  are age 32 to 59 years and a Market researcher living in a residence  hall, or have one of several medical conditions, you need to get vaccinated against meningococcal disease. You may also need additional booster doses. 17. Hepatitis A vaccine.** / Consult your health care provider. 18. Hepatitis B vaccine.** / Consult your health care provider. 19. Haemophilus influenzae type b (Hib) vaccine.** / Consult your health care provider.  11. Chlamydia, HIV, and other sexually transmitted diseases- Get screened every year until age 65, then within three months of each new sexual provider. 21. Pap Smear- Every 1-3 years; discuss with your health care provider. 23. Mammogram- Every year starting at age 31  Take these steps 1. Do not smoke-Your healthcare provider can help you quit.  For tips on how to quit go to www.smokefree.gov or call 1-800 QUITNOW. 2. Be physically active- Exercise 5 days a week for at least 30 minutes.  If you are not already physically active, start slow and gradually work up to 30 minutes of moderate physical activity.  Examples of moderate activity include walking briskly, dancing, swimming, bicycling, etc. 3. Breast Cancer- A self breast exam every month is important for early detection of breast cancer.  For more information and instruction on self breast exams, ask your healthcare provider or https://www.patel.info/. 4. Eat a healthy diet- Eat a variety of healthy foods such as fruits, vegetables, whole grains, low fat milk, low fat cheeses, yogurt, lean meats, poultry and fish, beans, nuts, tofu, etc.  For more information go to www. Thenutritionsource.org 5. Drink alcohol in moderation- Limit alcohol intake to one drink or less per day. Never drink and drive. 6. Depression- Your emotional health is as important as your physical health.  If you're feeling down or losing interest in things you normally enjoy please talk to your healthcare provider about being screened for depression. 7. Dental visit- Brush and floss your teeth  twice daily; visit your dentist twice a year. 8. Eye doctor- Get an eye exam at least every 2 years. 9. Helmet use- Always wear a helmet when riding a bicycle, motorcycle, rollerblading or skateboarding. 61. Safe sex- If you may be exposed to sexually transmitted infections, use a condom. 11. Seat belts- Seat belts can save your live; always wear one. 12. Smoke/Carbon Monoxide detectors- These detectors need to be installed on the appropriate level of your home. Replace batteries at least once a year. 13. Skin cancer- When out in the sun please cover up and use sunscreen 15 SPF or higher. 14. Violence- If anyone is threatening or hurting you, please tell your healthcare provider.

## 2014-12-22 NOTE — Progress Notes (Signed)
Complete Physical  Assessment and Plan: 1. Depression Depression- start on wellbutrin, stop zoloft, suggest counseling, follow up 1-2 months stress management techniques discussed, increase water, good sleep hygiene discussed, increase exercise, and increase veggies.   2. Anxiety  stress management techniques discussed, increase water, good sleep hygiene discussed, increase exercise, and increase veggies.  - TSH  3. Allergy, initial encounter Continue OTC allergy pills  4. IBS (irritable bowel syndrome) Diet controlled Some diarrhea only with weight loss, has had normal labs, no other symptoms/signs Declines GI referral  5. Routine general medical examination at a health care facility  6. Screening cholesterol level - Lipid panel  7. Screening for diabetes mellitus - Hemoglobin A1c - Insulin, fasting  8. Screening for blood or protein in urine - Urinalysis, Routine w reflex microscopic (not at [facility]) - Microalbumin / creatinine urine ratio  9. Tobacco use disorder Smoking cessation-  counseled patient on the dangers of tobacco use, advised patient to stop smoking, and reviewed strategies to maximize success,   10. Medication management - CBC with Differential/Platelet - BASIC METABOLIC PANEL WITH GFR - Hepatic function panel - Magnesium  11. Vitamin D deficiency - VITAMIN D 25 Hydroxy (Vit-D Deficiency, Fractures)  12. Other fatigue - Iron and TIBC - Ferritin - Vitamin B12   Discussed med's effects and SE's. Screening labs and tests as requested with regular follow-up as recommended.  HPI 29 y.o. pleasant white female  presents for a complete physical.  Her blood pressure has been controlled at home, today their BP is BP: 90/60 mmHg She does workout. She denies chest pain, shortness of breath, dizziness.  She is zoloft for anxiet/depression, she had a bad reaction with taking adderall with emotional liability. Has xanax as need to take and has been using the past  2 months.   She will have diarrhea/loose stools, never constipation, no dark black stool, blood in stool, AB pain.  Patient is on Vitamin D supplement.   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 She is eating, sleeping well with zoloft 1/2, taking xanax once a week.  She is no longer losing weight, she states she is still having some decreased appetite, eating well.  She would like to get back on wellbutrin and stop zoloft.  Wt Readings from Last 3 Encounters:  12/22/14 130 lb (58.968 kg)  02/03/14 132 lb (59.875 kg)  10/15/13 140 lb (63.504 kg)   Current Medications:  Current Outpatient Prescriptions on File Prior to Visit  Medication Sig Dispense Refill  . ALPRAZolam (XANAX) 0.5 MG tablet TAKE 1/2 TO 1 TABLET TWICE DAILY AS NEEDED 60 tablet 0  . sertraline (ZOLOFT) 50 MG tablet TAKE 1 TABLET BY MOUTH EVERY DAY 30 tablet 4   No current facility-administered medications on file prior to visit.   Health Maintenance:   Immunization History  Administered Date(s) Administered  . Tdap 06/02/2011    Tetanus:2013 Pneumovax: N/A Prevnar 13: N/A Flu vaccine: at work Zostavax: N/A Pap: 4 years, but states will make an appointment No history of abnormal pap LMP: Patient's last menstrual period was 11/22/2014. MGM: N/A DEXA: N/A Colonoscopy: N/A EGD: N/A  Allergies:  Allergies  Allergen Reactions  . Sulfa Antibiotics Anaphylaxis   Medical History:  Past Medical History  Diagnosis Date  . No pertinent past medical history   . PONV (postoperative nausea and vomiting)   .  Pregnancy induced hypertension   . IBS (irritable bowel syndrome)   . Depression   . ADD (attention deficit disorder)   . Anxiety   . Allergy    Surgical History:  Past Surgical History  Procedure Laterality Date  . Anterior cruciate ligament repair     Family History:  Family History  Problem Relation Age of Onset  .  Hypertension Mother   . Hypertension Father   . Anesthesia problems Neg Hx   . Hypotension Neg Hx   . Malignant hyperthermia Neg Hx   . Pseudochol deficiency Neg Hx    Social History:  Social History  Substance Use Topics  . Smoking status: Current Every Day Smoker  . Smokeless tobacco: Never Used  . Alcohol Use: No   Review of Systems  Constitutional: Positive for malaise/fatigue. Negative for fever, chills, weight loss and diaphoresis.  HENT: Negative.   Eyes: Negative.   Respiratory: Negative.   Cardiovascular: Negative.   Gastrointestinal: Positive for diarrhea. Negative for heartburn, nausea, vomiting, abdominal pain, constipation, blood in stool and melena.  Genitourinary: Negative.   Musculoskeletal: Negative.   Skin: Negative.   Neurological: Negative.  Negative for weakness.  Psychiatric/Behavioral: Negative for depression, suicidal ideas, hallucinations, memory loss and substance abuse. The patient is nervous/anxious. The patient does not have insomnia.     Physical Exam: Estimated body mass index is 23.03 kg/(m^2) as calculated from the following:   Height as of this encounter: 5\' 3"  (1.6 m).   Weight as of this encounter: 130 lb (58.968 kg). BP 90/60 mmHg  Pulse 99  Temp(Src) 97.9 F (36.6 C) (Temporal)  Resp 16  Ht 5\' 3"  (1.6 m)  Wt 130 lb (58.968 kg)  BMI 23.03 kg/m2  SpO2 97%  LMP 11/22/2014 General Appearance: Well nourished, in no apparent distress. Eyes: PERRLA, EOMs, conjunctiva no swelling or erythema, normal fundi and vessels. Sinuses: No Frontal/maxillary tenderness ENT/Mouth: Ext aud canals clear, normal light reflex with TMs without erythema, bulging.  Good dentition. No erythema, swelling, or exudate on post pharynx. Tonsils not swollen or erythematous. Hearing normal.  Neck: Supple, thyroid normal. No bruits Respiratory: Respiratory effort normal, BS equal bilaterally without rales, rhonchi, wheezing or stridor. Cardio: RRR without murmurs,  rubs or gallops. Brisk peripheral pulses without edema.  Chest: symmetric, with normal excursions and percussion. Breasts:declines will go to OB/GYN Abdomen: Soft, no tenderness, no guarding, rebound, hernias, masses, or organomegaly. .  Lymphatics: Non tender without lymphadenopathy.  Genitourinary: declines will go to OB/GYN Musculoskeletal: Full ROM all peripheral extremities,5/5 strength, and normal gait. Skin: Warm, dry without rashes, lesions, ecchymosis. 2x2 dark mid lower back, left side irreg 3x4. 1x1 left shoulder unchanged Neuro: Cranial nerves intact, reflexes equal bilaterally. Normal muscle tone, no cerebellar symptoms. Sensation intact.  Psych: Awake and oriented X 3, normal affect, Insight and Judgment appropriate.   EKG: defer   Vicie Mutters 2:08 PM

## 2014-12-23 ENCOUNTER — Telehealth: Payer: Self-pay | Admitting: *Deleted

## 2014-12-23 ENCOUNTER — Telehealth: Payer: Self-pay | Admitting: Internal Medicine

## 2014-12-23 LAB — MAGNESIUM: Magnesium: 1.8 mg/dL (ref 1.5–2.5)

## 2014-12-23 LAB — HEPATIC FUNCTION PANEL
ALK PHOS: 50 U/L (ref 33–115)
ALT: 8 U/L (ref 6–29)
AST: 9 U/L — ABNORMAL LOW (ref 10–30)
Albumin: 4.6 g/dL (ref 3.6–5.1)
BILIRUBIN DIRECT: 0.1 mg/dL (ref <=0.2)
BILIRUBIN INDIRECT: 0.5 mg/dL (ref 0.2–1.2)
Total Bilirubin: 0.6 mg/dL (ref 0.2–1.2)
Total Protein: 6.9 g/dL (ref 6.1–8.1)

## 2014-12-23 LAB — BASIC METABOLIC PANEL WITH GFR
BUN: 13 mg/dL (ref 7–25)
CALCIUM: 9 mg/dL (ref 8.6–10.2)
CO2: 28 mmol/L (ref 20–31)
Chloride: 104 mmol/L (ref 98–110)
Creat: 0.6 mg/dL (ref 0.50–1.10)
GLUCOSE: 91 mg/dL (ref 65–99)
POTASSIUM: 3.7 mmol/L (ref 3.5–5.3)
SODIUM: 138 mmol/L (ref 135–146)

## 2014-12-23 LAB — IRON AND TIBC
%SAT: 43 % (ref 11–50)
Iron: 162 ug/dL (ref 40–190)
TIBC: 381 ug/dL (ref 250–450)
UIBC: 219 ug/dL (ref 125–400)

## 2014-12-23 LAB — FERRITIN: Ferritin: 28 ng/mL (ref 10–291)

## 2014-12-23 LAB — TSH: TSH: 0.653 u[IU]/mL (ref 0.350–4.500)

## 2014-12-23 LAB — LIPID PANEL
CHOL/HDL RATIO: 3.7 ratio (ref <=5.0)
CHOLESTEROL: 121 mg/dL — AB (ref 125–200)
HDL: 33 mg/dL — ABNORMAL LOW (ref >=46)
LDL CALC: 39 mg/dL (ref <130)
TRIGLYCERIDES: 247 mg/dL — AB (ref <150)
VLDL: 49 mg/dL — AB (ref <30)

## 2014-12-23 LAB — VITAMIN B12: Vitamin B-12: 318 pg/mL (ref 211–911)

## 2014-12-23 LAB — INSULIN, FASTING: Insulin fasting, serum: 39.9 u[IU]/mL — ABNORMAL HIGH (ref 2.0–19.6)

## 2014-12-23 LAB — VITAMIN D 25 HYDROXY (VIT D DEFICIENCY, FRACTURES): Vit D, 25-Hydroxy: 29 ng/mL — ABNORMAL LOW (ref 30–100)

## 2014-12-23 MED ORDER — BUPROPION HCL ER (XL) 150 MG PO TB24
150.0000 mg | ORAL_TABLET | ORAL | Status: DC
Start: 2014-12-23 — End: 2015-03-19

## 2014-12-23 NOTE — Addendum Note (Signed)
Addended by: Journiee Feldkamp A on: 12/23/2014 08:40 AM   Modules accepted: Orders

## 2014-12-23 NOTE — Telephone Encounter (Signed)
ERROR

## 2014-12-23 NOTE — Telephone Encounter (Signed)
done

## 2015-03-09 ENCOUNTER — Ambulatory Visit (INDEPENDENT_AMBULATORY_CARE_PROVIDER_SITE_OTHER): Payer: 59 | Admitting: Internal Medicine

## 2015-03-09 ENCOUNTER — Encounter: Payer: Self-pay | Admitting: Internal Medicine

## 2015-03-09 VITALS — BP 108/62 | HR 80 | Temp 98.0°F | Resp 16 | Ht 63.0 in | Wt 135.0 lb

## 2015-03-09 DIAGNOSIS — F988 Other specified behavioral and emotional disorders with onset usually occurring in childhood and adolescence: Secondary | ICD-10-CM

## 2015-03-09 DIAGNOSIS — F419 Anxiety disorder, unspecified: Secondary | ICD-10-CM

## 2015-03-09 DIAGNOSIS — F329 Major depressive disorder, single episode, unspecified: Secondary | ICD-10-CM

## 2015-03-09 DIAGNOSIS — F909 Attention-deficit hyperactivity disorder, unspecified type: Secondary | ICD-10-CM

## 2015-03-09 DIAGNOSIS — F32A Depression, unspecified: Secondary | ICD-10-CM

## 2015-03-09 MED ORDER — AMPHETAMINE-DEXTROAMPHET ER 10 MG PO CP24: 10.0000 mg | ORAL_CAPSULE | Freq: Every day | ORAL | Status: DC

## 2015-03-09 NOTE — Patient Instructions (Signed)
Recommend that you see [facility].  Address: [address] [place], [place] [address] Phone-[phone]   Dr. Arbutus Leas [phone]

## 2015-03-09 NOTE — Progress Notes (Signed)
Assessment and Plan:   1. Depression -wellbutrin  2. Anxiety -wellbutrin -xanax prn  3. ADD (attention deficit disorder) -adderall xr 10 mg -expressly told to take no more than 5 days per week to prevent dependence.    HPI 30 y.o. female  presents for 3 month follow up with depression/anxiety and also complaints of trouble with focusing.  Patient reports that she has been using wellbutrin since Decemeber.  She reports that she has been not having any issues with the medications.  She reports that she has been having a lot of issues with focusing.  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  She reports that she even has trouble completing simple tasks like laundry at home.  She has previously been on adderall xr in the past and felt like she did better with her job.  She would like to go back on it again.    Current Medications:  Current Outpatient Prescriptions on File Prior to Visit  Medication Sig Dispense Refill  . ALPRAZolam (XANAX) 0.5 MG tablet TAKE 1/2 TO 1 TABLET TWICE DAILY AS NEEDED 60 tablet 0  . buPROPion (WELLBUTRIN XL) 150 MG 24 hr tablet Take 1 tablet (150 mg total) by mouth every morning. 30 tablet 2   No current facility-administered medications on file prior to visit.    Medical History:  Past Medical History  Diagnosis Date  . No pertinent past medical history   . PONV (postoperative nausea and vomiting)   . Pregnancy induced hypertension   . IBS (irritable bowel syndrome)   . Depression   . ADD (attention deficit disorder)   . Anxiety   . Allergy     Allergies:  Allergies  Allergen Reactions  . Sulfa Antibiotics Anaphylaxis     Review of Systems:  Review of Systems  Constitutional: Negative for fever, chills and malaise/fatigue.  HENT: Negative for congestion, ear pain and sore throat.   Eyes: Negative.   Respiratory: Negative for cough, shortness of breath and wheezing.   Cardiovascular:  Negative for chest pain, palpitations and leg swelling.  Gastrointestinal: Negative for heartburn, vomiting, diarrhea, blood in stool and melena.  Genitourinary: Negative.   Skin: Negative.   Neurological: Negative for dizziness, sensory change, loss of consciousness and headaches.  Psychiatric/Behavioral: Positive for memory loss. Negative for depression, suicidal ideas and substance abuse. The patient is not nervous/anxious and does not have insomnia.     Family history- Review and unchanged  Social history- Review and unchanged  Physical Exam: BP 108/62 mmHg  Pulse 80  Temp(Src) 98 F (36.7 C) (Temporal)  Resp 16  Ht 5\' 3"  (1.6 m)  Wt 135 lb (61.236 kg)  BMI 23.92 kg/m2  LMP 03/02/2015 Wt Readings from Last 3 Encounters:  03/09/15 135 lb (61.236 kg)  12/22/14 130 lb (58.968 kg)  02/03/14 132 lb (59.875 kg)    General Appearance: Well nourished well developed, in no apparent distress. Eyes: PERRLA, EOMs, conjunctiva no swelling or erythema ENT/Mouth: Ear canals normal without obstruction, swelling, erythma, discharge.  TMs normal bilaterally.  Oropharynx moist, clear, without exudate, or postoropharyngeal swelling. Neck: Supple, thyroid normal,no cervical adenopathy  Respiratory: Respiratory effort normal, Breath sounds clear A&P without rhonchi, wheeze, or rale.  No retractions, no accessory usage. Cardio: RRR with no MRGs. Brisk peripheral pulses without edema.  Abdomen: Soft, + BS,  Non tender, no guarding, rebound, hernias, masses. Musculoskeletal: Full ROM, 5/5 strength, Normal gait Skin: Warm, dry without  rashes, lesions, ecchymosis.  Neuro: Awake and oriented X 3, Cranial nerves intact. Normal muscle tone, no cerebellar symptoms. Psych: Normal affect, Insight and Judgment appropriate.    Starlyn Skeans, PA-C 9:23 AM [facility]

## 2015-03-19 ENCOUNTER — Other Ambulatory Visit: Payer: Self-pay | Admitting: Physician Assistant

## 2015-04-19 ENCOUNTER — Other Ambulatory Visit: Payer: Self-pay | Admitting: Internal Medicine

## 2015-04-19 MED ORDER — AMPHETAMINE-DEXTROAMPHET ER 10 MG PO CP24: 10.0000 mg | ORAL_CAPSULE | Freq: Every day | ORAL | Status: DC

## 2015-06-01 ENCOUNTER — Other Ambulatory Visit: Payer: Self-pay | Admitting: *Deleted

## 2015-06-01 MED ORDER — AMPHETAMINE-DEXTROAMPHET ER 10 MG PO CP24: 10.0000 mg | ORAL_CAPSULE | Freq: Every day | ORAL | Status: DC

## 2015-06-20 ENCOUNTER — Other Ambulatory Visit: Payer: Self-pay | Admitting: Physician Assistant

## 2015-08-17 ENCOUNTER — Ambulatory Visit: Payer: Self-pay | Admitting: Internal Medicine

## 2015-08-25 ENCOUNTER — Other Ambulatory Visit: Payer: Self-pay | Admitting: *Deleted

## 2015-08-25 MED ORDER — AMPHETAMINE-DEXTROAMPHET ER 10 MG PO CP24: 10.0000 mg | ORAL_CAPSULE | Freq: Every day | ORAL | 0 refills | Status: DC

## 2015-09-06 ENCOUNTER — Ambulatory Visit (INDEPENDENT_AMBULATORY_CARE_PROVIDER_SITE_OTHER): Payer: 59 | Admitting: Internal Medicine

## 2015-09-06 ENCOUNTER — Encounter: Payer: Self-pay | Admitting: Internal Medicine

## 2015-09-06 VITALS — BP 112/66 | HR 70 | Temp 98.0°F | Resp 16 | Ht 63.0 in | Wt 146.0 lb

## 2015-09-06 DIAGNOSIS — F419 Anxiety disorder, unspecified: Secondary | ICD-10-CM | POA: Diagnosis not present

## 2015-09-06 DIAGNOSIS — F329 Major depressive disorder, single episode, unspecified: Secondary | ICD-10-CM

## 2015-09-06 DIAGNOSIS — F909 Attention-deficit hyperactivity disorder, unspecified type: Secondary | ICD-10-CM | POA: Diagnosis not present

## 2015-09-06 DIAGNOSIS — F32A Depression, unspecified: Secondary | ICD-10-CM

## 2015-09-06 MED ORDER — BUPROPION HCL ER (XL) 150 MG PO TB24: 150.0000 mg | ORAL_TABLET | Freq: Every day | ORAL | 3 refills | Status: DC

## 2015-09-06 MED ORDER — AMPHETAMINE-DEXTROAMPHET ER 10 MG PO CP24: 10.0000 mg | ORAL_CAPSULE | Freq: Every day | ORAL | 0 refills | Status: DC

## 2015-09-06 MED ORDER — ALPRAZOLAM 0.5 MG PO TABS: ORAL_TABLET | ORAL | 0 refills | Status: DC

## 2015-09-06 NOTE — Progress Notes (Signed)
Assessment and Plan:   1. Depression -cont wellbutrin  2. Anxiety -cont wellbutrin -cont xanax prn as needed  3. Attention deficit hyperactivity disorder (ADHD), unspecified ADHD type -adderall xr prescription printed -patient to fill when ready    HPI 30 y.o.female presents for  month follow up of depression and anxiety and ADD.  She reports that she is doing really well.  She has been going through a divorce and this process is nearly complete.  She has quit smoking and drinking.  She is taking minimal adderall and xanax.  She reports that she only takes this when she absolutely needs to.  She does well at work on the 10 mg adderall XR dose. They report no adverse reactions to medications including insomnia, palpatiations, chest pain or shortness of breath.      Past Medical History:  Diagnosis Date  . ADD (attention deficit disorder)   . Allergy   . Anxiety   . Depression   . IBS (irritable bowel syndrome)   . No pertinent past medical history   . PONV (postoperative nausea and vomiting)   . Pregnancy induced hypertension      Allergies  Allergen Reactions  . Sulfa Antibiotics Anaphylaxis      Current Outpatient Prescriptions on File Prior to Visit  Medication Sig Dispense Refill  . ALPRAZolam (XANAX) 0.5 MG tablet TAKE 1/2 TO 1 TABLET TWICE DAILY AS NEEDED 60 tablet 0  . amphetamine-dextroamphetamine (ADDERALL XR) 10 MG 24 hr capsule Take 1 capsule (10 mg total) by mouth daily. 30 capsule 0  . buPROPion (WELLBUTRIN XL) 150 MG 24 hr tablet TAKE 1 TABLET (150 MG TOTAL) BY MOUTH EVERY MORNING. 30 tablet 3   No current facility-administered medications on file prior to visit.     ROS: all negative except above.   Physical Exam: Filed Weights   09/06/15 1102  Weight: 146 lb (66.2 kg)   BP 112/66   Pulse 70   Temp 98 F (36.7 C) (Temporal)   Resp 16   Ht 5\' 3"  (1.6 m)   Wt 146 lb (66.2 kg)   LMP 08/21/2015   BMI 25.86 kg/m  General Appearance: Well  developed well nourished, non-toxic appearing in no apparent distress. Eyes: PERRLA, EOMs, conjunctiva w/ no swelling or erythema or discharge Sinuses: No Frontal/maxillary tenderness ENT/Mouth: Ear canals clear without swelling or erythema.  TM's normal bilaterally with no retractions, bulging, or loss of landmarks.   Neck: Supple, thyroid normal, no notable JVD  Respiratory: Respiratory effort normal, Clear breath sounds anteriorly and posteriorly bilaterally without rales, rhonchi, wheezing or stridor. No retractions or accessory muscle usage. Cardio: RRR with no MRGs.   Abdomen: Soft, + BS.  Non tender, no guarding, rebound, hernias, masses.  Musculoskeletal: Full ROM, 5/5 strength, normal gait.  Skin: Warm, dry without rashes  Neuro: Awake and oriented X 3, Cranial nerves intact. Normal muscle tone, no cerebellar symptoms. Sensation intact.  Psych: normal affect, Insight and Judgment appropriate.     Starlyn Skeans, PA-C 11:24 AM [facility]

## 2015-11-08 ENCOUNTER — Other Ambulatory Visit: Payer: Self-pay | Admitting: *Deleted

## 2015-11-08 MED ORDER — AMPHETAMINE-DEXTROAMPHET ER 10 MG PO CP24: 10.0000 mg | ORAL_CAPSULE | Freq: Every day | ORAL | 0 refills | Status: DC

## 2015-12-21 ENCOUNTER — Other Ambulatory Visit: Payer: Self-pay | Admitting: *Deleted

## 2015-12-21 MED ORDER — AMPHETAMINE-DEXTROAMPHET ER 10 MG PO CP24: 10.0000 mg | ORAL_CAPSULE | Freq: Every day | ORAL | 0 refills | Status: DC

## 2015-12-28 ENCOUNTER — Encounter: Payer: Self-pay | Admitting: Physician Assistant

## 2016-01-23 ENCOUNTER — Encounter: Payer: Self-pay | Admitting: Physician Assistant

## 2016-01-23 NOTE — Progress Notes (Deleted)
Complete Physical  Assessment and Plan: 1. Depression Depression- start on wellbutrin, stop zoloft, suggest counseling, follow up 1-2 months stress management techniques discussed, increase water, good sleep hygiene discussed, increase exercise, and increase veggies.   2. Anxiety  stress management techniques discussed, increase water, good sleep hygiene discussed, increase exercise, and increase veggies.  - TSH  3. Allergy, initial encounter Continue OTC allergy pills  4. IBS (irritable bowel syndrome) Diet controlled Some diarrhea only with weight loss, has had normal labs, no other symptoms/signs Declines GI referral  5. Routine general medical examination at a health care facility  6. Screening cholesterol level - Lipid panel  7. Screening for diabetes mellitus - Hemoglobin A1c - Insulin, fasting  8. Screening for blood or protein in urine - Urinalysis, Routine w reflex microscopic (not at [facility]) - Microalbumin / creatinine urine ratio  9. Tobacco use disorder Smoking cessation-  counseled patient on the dangers of tobacco use, advised patient to stop smoking, and reviewed strategies to maximize success,   10. Medication management - CBC with Differential/Platelet - BASIC METABOLIC PANEL WITH GFR - Hepatic function panel - Magnesium  11. Vitamin D deficiency - VITAMIN D 25 Hydroxy (Vit-D Deficiency, Fractures)  12. Other fatigue - Iron and TIBC - Ferritin - Vitamin B12   Discussed med's effects and SE's. Screening labs and tests as requested with regular follow-up as recommended.  HPI 31 y.o. pleasant white female  presents for a complete physical.  Her blood pressure has been controlled at home, today their BP is   She does workout. She denies chest pain, shortness of breath, dizziness.  She is zoloft for anxiet/depression, she had a bad reaction with taking adderall with emotional liability. Has xanax as need to take and has been using the past 2 months.    She will have diarrhea/loose stools, never constipation, no dark black stool, blood in stool, AB pain.  Patient is on Vitamin D supplement.   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 She is eating, sleeping well with zoloft 1/2, taking xanax once a week.  She is no longer losing weight, she states she is still having some decreased appetite, eating well.  She would like to get back on wellbutrin and stop zoloft.  Wt Readings from Last 3 Encounters:  09/06/15 146 lb (66.2 kg)  03/09/15 135 lb (61.2 kg)  12/22/14 130 lb (59 kg)   Current Medications:  Current Outpatient Prescriptions on File Prior to Visit  Medication Sig Dispense Refill  . ALPRAZolam (XANAX) 0.5 MG tablet TAKE 1/2 TO 1 TABLET TWICE DAILY AS NEEDED 60 tablet 0  . amphetamine-dextroamphetamine (ADDERALL XR) 10 MG 24 hr capsule Take 1 capsule (10 mg total) by mouth daily. 30 capsule 0  . buPROPion (WELLBUTRIN XL) 150 MG 24 hr tablet Take 1 tablet (150 mg total) by mouth daily. 90 tablet 3   No current facility-administered medications on file prior to visit.    Health Maintenance:   Immunization History  Administered Date(s) Administered  . Influenza Nasal 10/22/2014  . Tdap 06/02/2011    Tetanus:2013 Pneumovax: N/A Prevnar 13: N/A Flu vaccine: at work Zostavax: N/A  Pap: 4 years, but states will make an appointment No history of abnormal pap  LMP: No LMP recorded. MGM: N/A DEXA: N/A Colonoscopy: N/A EGD: N/A  Medical History:  Past Medical History:  Diagnosis Date  .  ADD (attention deficit disorder)   . Allergy   . Anxiety   . Depression   . IBS (irritable bowel syndrome)   . No pertinent past medical history   . PONV (postoperative nausea and vomiting)   . Pregnancy induced hypertension    Allergies Allergies  Allergen Reactions  . Sulfa Antibiotics Anaphylaxis    SURGICAL HISTORY She  has a past surgical  history that includes Anterior cruciate ligament repair. FAMILY HISTORY Her family history includes Hypertension in her father and mother. SOCIAL HISTORY She  reports that she quit smoking about 5 months ago. She has never used smokeless tobacco. She reports that she does not drink alcohol or use drugs.  Review of Systems  Constitutional: Positive for malaise/fatigue. Negative for chills, diaphoresis, fever and weight loss.  HENT: Negative.   Eyes: Negative.   Respiratory: Negative.   Cardiovascular: Negative.   Gastrointestinal: Positive for diarrhea. Negative for abdominal pain, blood in stool, constipation, heartburn, melena, nausea and vomiting.  Genitourinary: Negative.   Musculoskeletal: Negative.   Skin: Negative.   Neurological: Negative.  Negative for weakness.  Psychiatric/Behavioral: Negative for depression, hallucinations, memory loss, substance abuse and suicidal ideas. The patient is nervous/anxious. The patient does not have insomnia.     Physical Exam: Estimated body mass index is 25.86 kg/m as calculated from the following:   Height as of 09/06/15: 5\' 3"  (1.6 m).   Weight as of 09/06/15: 146 lb (66.2 kg). There were no vitals taken for this visit. General Appearance: Well nourished, in no apparent distress. Eyes: PERRLA, EOMs, conjunctiva no swelling or erythema, normal fundi and vessels. Sinuses: No Frontal/maxillary tenderness ENT/Mouth: Ext aud canals clear, normal light reflex with TMs without erythema, bulging.  Good dentition. No erythema, swelling, or exudate on post pharynx. Tonsils not swollen or erythematous. Hearing normal.  Neck: Supple, thyroid normal. No bruits Respiratory: Respiratory effort normal, BS equal bilaterally without rales, rhonchi, wheezing or stridor. Cardio: RRR without murmurs, rubs or gallops. Brisk peripheral pulses without edema.  Chest: symmetric, with normal excursions and percussion. Breasts:declines will go to OB/GYN Abdomen: Soft,  no tenderness, no guarding, rebound, hernias, masses, or organomegaly. .  Lymphatics: Non tender without lymphadenopathy.  Genitourinary: declines will go to OB/GYN Musculoskeletal: Full ROM all peripheral extremities,5/5 strength, and normal gait. Skin: Warm, dry without rashes, lesions, ecchymosis. 2x2 dark mid lower back, left side irreg 3x4. 1x1 left shoulder unchanged Neuro: Cranial nerves intact, reflexes equal bilaterally. Normal muscle tone, no cerebellar symptoms. Sensation intact.  Psych: Awake and oriented X 3, normal affect, Insight and Judgment appropriate.   EKG: defer   Vicie Mutters 11:57 AM

## 2016-02-07 ENCOUNTER — Encounter: Payer: Self-pay | Admitting: Physician Assistant

## 2016-02-07 ENCOUNTER — Ambulatory Visit (INDEPENDENT_AMBULATORY_CARE_PROVIDER_SITE_OTHER): Payer: 59 | Admitting: Physician Assistant

## 2016-02-07 VITALS — BP 110/70 | HR 77 | Temp 98.1°F | Resp 16 | Ht 64.0 in | Wt 153.8 lb

## 2016-02-07 DIAGNOSIS — F419 Anxiety disorder, unspecified: Secondary | ICD-10-CM

## 2016-02-07 DIAGNOSIS — Z1389 Encounter for screening for other disorder: Secondary | ICD-10-CM

## 2016-02-07 DIAGNOSIS — F329 Major depressive disorder, single episode, unspecified: Secondary | ICD-10-CM

## 2016-02-07 DIAGNOSIS — Z0001 Encounter for general adult medical examination with abnormal findings: Secondary | ICD-10-CM | POA: Diagnosis not present

## 2016-02-07 DIAGNOSIS — Z79899 Other long term (current) drug therapy: Secondary | ICD-10-CM

## 2016-02-07 DIAGNOSIS — D649 Anemia, unspecified: Secondary | ICD-10-CM

## 2016-02-07 DIAGNOSIS — Z0000 Encounter for general adult medical examination without abnormal findings: Secondary | ICD-10-CM

## 2016-02-07 DIAGNOSIS — L219 Seborrheic dermatitis, unspecified: Secondary | ICD-10-CM

## 2016-02-07 DIAGNOSIS — R6889 Other general symptoms and signs: Secondary | ICD-10-CM | POA: Diagnosis not present

## 2016-02-07 DIAGNOSIS — E559 Vitamin D deficiency, unspecified: Secondary | ICD-10-CM

## 2016-02-07 DIAGNOSIS — F909 Attention-deficit hyperactivity disorder, unspecified type: Secondary | ICD-10-CM

## 2016-02-07 DIAGNOSIS — K589 Irritable bowel syndrome without diarrhea: Secondary | ICD-10-CM

## 2016-02-07 DIAGNOSIS — F32A Depression, unspecified: Secondary | ICD-10-CM

## 2016-02-07 DIAGNOSIS — F17200 Nicotine dependence, unspecified, uncomplicated: Secondary | ICD-10-CM

## 2016-02-07 DIAGNOSIS — R7989 Other specified abnormal findings of blood chemistry: Secondary | ICD-10-CM

## 2016-02-07 DIAGNOSIS — Z131 Encounter for screening for diabetes mellitus: Secondary | ICD-10-CM

## 2016-02-07 DIAGNOSIS — Z13220 Encounter for screening for lipoid disorders: Secondary | ICD-10-CM

## 2016-02-07 LAB — CBC WITH DIFFERENTIAL/PLATELET
BASOS PCT: 0 %
Basophils Absolute: 0 cells/uL (ref 0–200)
EOS ABS: 68 {cells}/uL (ref 15–500)
Eosinophils Relative: 1 %
HEMATOCRIT: 40 % (ref 35.0–45.0)
Hemoglobin: 13.4 g/dL (ref 11.7–15.5)
LYMPHS PCT: 26 %
Lymphs Abs: 1768 cells/uL (ref 850–3900)
MCH: 31.2 pg (ref 27.0–33.0)
MCHC: 33.5 g/dL (ref 32.0–36.0)
MCV: 93 fL (ref 80.0–100.0)
MONO ABS: 612 {cells}/uL (ref 200–950)
MPV: 10.3 fL (ref 7.5–12.5)
Monocytes Relative: 9 %
NEUTROS PCT: 64 %
Neutro Abs: 4352 cells/uL (ref 1500–7800)
Platelets: 306 10*3/uL (ref 140–400)
RBC: 4.3 MIL/uL (ref 3.80–5.10)
RDW: 12.8 % (ref 11.0–15.0)
WBC: 6.8 10*3/uL (ref 3.8–10.8)

## 2016-02-07 LAB — BASIC METABOLIC PANEL WITH GFR
BUN: 12 mg/dL (ref 7–25)
CO2: 28 mmol/L (ref 20–31)
Calcium: 9 mg/dL (ref 8.6–10.2)
Chloride: 104 mmol/L (ref 98–110)
Creat: 0.64 mg/dL (ref 0.50–1.10)
GFR, Est Non African American: >89 mL/min (ref >=60)
GLUCOSE: 79 mg/dL (ref 65–99)
POTASSIUM: 4.2 mmol/L (ref 3.5–5.3)
Sodium: 139 mmol/L (ref 135–146)

## 2016-02-07 LAB — HEPATIC FUNCTION PANEL
ALBUMIN: 4.4 g/dL (ref 3.6–5.1)
ALK PHOS: 56 U/L (ref 33–115)
ALT: 10 U/L (ref 6–29)
AST: 12 U/L (ref 10–30)
Bilirubin, Direct: 0.1 mg/dL (ref <=0.2)
Indirect Bilirubin: 0.2 mg/dL (ref 0.2–1.2)
TOTAL PROTEIN: 6.9 g/dL (ref 6.1–8.1)
Total Bilirubin: 0.3 mg/dL (ref 0.2–1.2)

## 2016-02-07 LAB — LIPID PANEL
Cholesterol: 131 mg/dL (ref <200)
HDL: 52 mg/dL (ref >50)
LDL CALC: 69 mg/dL (ref <100)
TRIGLYCERIDES: 52 mg/dL (ref <150)
Total CHOL/HDL Ratio: 2.5 Ratio (ref <5.0)
VLDL: 10 mg/dL (ref <30)

## 2016-02-07 LAB — MAGNESIUM: MAGNESIUM: 1.7 mg/dL (ref 1.5–2.5)

## 2016-02-07 LAB — IRON AND TIBC
%SAT: 21 % (ref 11–50)
IRON: 76 ug/dL (ref 40–190)
TIBC: 369 ug/dL (ref 250–450)
UIBC: 293 ug/dL (ref 125–400)

## 2016-02-07 LAB — VITAMIN B12: Vitamin B-12: 357 pg/mL (ref 200–1100)

## 2016-02-07 LAB — HEMOGLOBIN A1C
HEMOGLOBIN A1C: 4.5 % (ref <5.7)
MEAN PLASMA GLUCOSE: 82 mg/dL

## 2016-02-07 LAB — TSH: TSH: 1.06 mIU/L

## 2016-02-07 LAB — FERRITIN: Ferritin: 23 ng/mL (ref 10–154)

## 2016-02-07 MED ORDER — LISDEXAMFETAMINE DIMESYLATE 30 MG PO CAPS: 30.0000 mg | ORAL_CAPSULE | Freq: Every day | ORAL | 0 refills | Status: DC

## 2016-02-07 MED ORDER — CICLOPIROX 1 % EX SHAM: 1.0000 | MEDICATED_SHAMPOO | Freq: Every day | CUTANEOUS | 2 refills | Status: DC

## 2016-02-07 MED ORDER — CLOBETASOL PROPIONATE 0.05 % EX SOLN: 1.0000 "application " | Freq: Two times a day (BID) | CUTANEOUS | 0 refills | Status: DC

## 2016-02-07 NOTE — Progress Notes (Signed)
Complete Physical  Assessment and Plan:  Depression remission Continue medications   Anxiety  stress management techniques discussed, increase water, good sleep hygiene discussed, increase exercise, and increase veggies.  - TSH  Allergy, initial encounter Continue OTC allergy pills   IBS (irritable bowel syndrome) Diet controlled   Routine general medical examination at a health care facility   Screening cholesterol level - Lipid panel   Screening for diabetes mellitus - Hemoglobin A1c - Insulin, fasting  Screening for blood or protein in urine - Urinalysis, Routine w reflex microscopic (not at [facility]) - Microalbumin / creatinine urine ratio  Tobacco use disorder Smoking cessation-  commended patient for quitting and reviewed strategies for preventing relapses,   Medication management - CBC with Differential/Platelet - BASIC METABOLIC PANEL WITH GFR - Hepatic function panel - Magnesium  Vitamin D deficiency - VITAMIN D 25 Hydroxy (Vit-D Deficiency, Fractures)  ADD Will try vyvanse 30mg  to avoid crashes/physical symptoms  Seborrheic dermatitis of scalp -     Ciclopirox 1 % shampoo; Apply 1 Applicatorful topically at bedtime. -     clobetasol (TEMOVATE) 0.05 % external solution; Apply 1 application topically 2 (two) times daily.  Abnormal thyroid blood test -     TSH -     Thyroid peroxidase antibody -     US SOFT TISSUE HEAD AND NECK; Future -     Thyroglobulin antibody  Anemia, unspecified type -     Iron and TIBC -     Ferritin -     Vitamin B12   Discussed med's effects and SE's. Screening labs and tests as requested with regular follow-up as recommended.  HPI 31 y.o. pleasant white female  presents for a complete physical.  Her blood pressure has been controlled at home, today their BP is BP: 110/70 She does workout. She denies chest pain, shortness of breath, dizziness.  She is on wellbutrin, occ xanax, and she is on adderall XR but states that she  has drop at 3PM, would like to try  Vyvance.  She complains of extremely dry scalp x 2 months, mild itching, has tried tar shampoo, declines new shampoos.  She will have diarrhea/loose stools, never constipation, no dark black stool, blood in stool, AB pain.  Patient is on Vitamin D supplement.   She is a paramedic, has been divorced x 3 days, has Maddox, primary custody.  Wt Readings from Last 3 Encounters:  02/07/16 153 lb 12.8 oz (69.8 kg)  09/06/15 146 lb (66.2 kg)  03/09/15 135 lb (61.2 kg)   Current Medications:  Current Outpatient Prescriptions on File Prior to Visit  Medication Sig Dispense Refill  . ALPRAZolam (XANAX) 0.5 MG tablet TAKE 1/2 TO 1 TABLET TWICE DAILY AS NEEDED 60 tablet 0  . amphetamine-dextroamphetamine (ADDERALL XR) 10 MG 24 hr capsule Take 1 capsule (10 mg total) by mouth daily. 30 capsule 0  . buPROPion (WELLBUTRIN XL) 150 MG 24 hr tablet Take 1 tablet (150 mg total) by mouth daily. 90 tablet 3   No current facility-administered medications on file prior to visit.    Health Maintenance:   Immunization History  Administered Date(s) Administered  . Influenza Nasal 10/22/2014  . Tdap 06/02/2011    Tetanus:2013 Pneumovax: N/A Prevnar 13: N/A Flu vaccine: 2017 at work  Zostavax: N/A  Pap: 2014, not sexually, Dr. Philis Pique No history of abnormal pap  LMP: Patient's last menstrual period was 01/21/2016. MGM: N/A DEXA: N/A Colonoscopy: N/A EGD: N/A  Medical History:  Past Medical  History:  Diagnosis Date  . ADD (attention deficit disorder)   . Allergy   . Anxiety   . Depression   . IBS (irritable bowel syndrome)   . No pertinent past medical history   . PONV (postoperative nausea and vomiting)   . Pregnancy induced hypertension    Allergies Allergies  Allergen Reactions  . Sulfa Antibiotics Anaphylaxis    SURGICAL HISTORY She  has a past surgical history that includes Anterior cruciate ligament repair. FAMILY HISTORY Her family history  includes Hypertension in her father and mother. SOCIAL HISTORY She  reports that she quit smoking about 6 months ago. She has never used smokeless tobacco. She reports that she does not drink alcohol or use drugs.  Review of Systems  Constitutional: Positive for malaise/fatigue. Negative for chills, diaphoresis, fever and weight loss.  HENT: Negative.   Eyes: Negative.   Respiratory: Negative.   Cardiovascular: Negative.   Gastrointestinal: Positive for diarrhea. Negative for abdominal pain, blood in stool, constipation, heartburn, melena, nausea and vomiting.  Genitourinary: Negative.   Musculoskeletal: Negative.   Skin: Negative.   Neurological: Negative.  Negative for weakness.  Psychiatric/Behavioral: Negative for depression, hallucinations, memory loss, substance abuse and suicidal ideas. The patient is nervous/anxious. The patient does not have insomnia.     Physical Exam: Estimated body mass index is 26.4 kg/m as calculated from the following:   Height as of this encounter: 5\' 4"  (1.626 m).   Weight as of this encounter: 153 lb 12.8 oz (69.8 kg). BP 110/70   Pulse 77   Temp 98.1 F (36.7 C)   Resp 16   Ht 5\' 4"  (1.626 m)   Wt 153 lb 12.8 oz (69.8 kg)   LMP 01/21/2016   SpO2 98%   BMI 26.40 kg/m  General Appearance: Well nourished, in no apparent distress. Eyes: PERRLA, EOMs, conjunctiva no swelling or erythema, normal fundi and vessels. Sinuses: No Frontal/maxillary tenderness ENT/Mouth: Ext aud canals clear, normal light reflex with TMs without erythema, bulging.  Good dentition. No erythema, swelling, or exudate on post pharynx. Tonsils not swollen or erythematous. Hearing normal.  Neck: Supple, thyroid slightly prominent, nontender, with larger nodule left upper side. No bruits Respiratory: Respiratory effort normal, BS equal bilaterally without rales, rhonchi, wheezing or stridor. Cardio: RRR without murmurs, rubs or gallops. Brisk peripheral pulses without edema.   Chest: symmetric, with normal excursions and percussion. Breasts:declines will go to OB/GYN Abdomen: Soft, no tenderness, no guarding, rebound, hernias, masses, or organomegaly. .  Lymphatics: Non tender without lymphadenopathy.  Genitourinary: declines will go to OB/GYN Musculoskeletal: Full ROM all peripheral extremities,5/5 strength, and normal gait. Skin: Warm, dry without rashes, lesions, ecchymosis. 2x2 dark mid lower back, left side irreg 3x4. 1x1 left shoulder unchanged Neuro: Cranial nerves intact, reflexes equal bilaterally. Normal muscle tone, no cerebellar symptoms. Sensation intact.  Psych: Awake and oriented X 3, normal affect, Insight and Judgment appropriate.   EKG: defer   Vicie Mutters 9:46 AM

## 2016-02-07 NOTE — Patient Instructions (Addendum)
Need to schedule with Dr. Philis Pique for PAP or we can get here  Will try the vyvanse 30  Seborrheic Dermatitis, Adult Seborrheic dermatitis is a skin disease that causes red, scaly patches. It usually occurs on the scalp, and it is often called dandruff. The patches may appear on other parts of the body. Skin patches tend to appear where there are many oil glands in the skin. Areas of the body that are commonly affected include:  Scalp.  Skin folds of the body.  Ears.  Eyebrows.  Neck.  Face.  Armpits.  The bearded area of men's faces. The condition may come and go for no known reason, and it is often long-lasting (chronic). What are the causes? The cause of this condition is not known. What increases the risk? This condition is more likely to develop in people who:  Have certain conditions, such as:  HIV (human immunodeficiency virus).  AIDS (acquired immunodeficiency syndrome).  Parkinson disease.  Mood disorders, such as depression.  Are 94-107 years old. What are the signs or symptoms? Symptoms of this condition include:  Thick scales on the scalp.  Redness on the face or in the armpits.  Skin that is flaky. The flakes may be white or yellow.  Skin that seems oily or dry but is not helped with moisturizers.  Itching or burning in the affected areas. How is this diagnosed? This condition is diagnosed with a medical history and physical exam. A sample of your skin may be tested (skin biopsy). You may need to see a skin specialist (dermatologist). How is this treated? There is no cure for this condition, but treatment can help to manage the symptoms. You may get treatment to remove scales, lower the risk of skin infection, and reduce swelling or itching. Treatment may include:  Creams that reduce swelling and irritation (steroids).  Creams that reduce skin yeast.  Medicated shampoo, soaps, moisturizing creams, or ointments.  Medicated moisturizing creams or  ointments. Follow these instructions at home:  Apply over-the-counter and prescription medicines only as told by your health care provider.  Use any medicated shampoo, soaps, skin creams, or ointments only as told by your health care provider.  Keep all follow-up visits as told by your health care provider. This is important. Contact a health care provider if:  Your symptoms do not improve with treatment.  Your symptoms get worse.  You have new symptoms. This information is not intended to replace advice given to you by your health care provider. Make sure you discuss any questions you have with your health care provider. Document Released: 12/18/2004 Document Revised: 07/08/2015 Document Reviewed: 04/07/2015 Elsevier Interactive Patient Education  2017 [employer].

## 2016-02-08 LAB — URINALYSIS, ROUTINE W REFLEX MICROSCOPIC

## 2016-02-08 LAB — THYROID PEROXIDASE ANTIBODY

## 2016-02-08 LAB — THYROGLOBULIN ANTIBODY

## 2016-02-08 LAB — VITAMIN D 25 HYDROXY (VIT D DEFICIENCY, FRACTURES): VIT D 25 HYDROXY: 31 ng/mL (ref 30–100)

## 2016-02-08 LAB — MICROALBUMIN / CREATININE URINE RATIO

## 2016-02-08 NOTE — Progress Notes (Signed)
Pt aware of lab results & voiced understanding of those results.

## 2016-02-09 NOTE — Progress Notes (Signed)
Pt aware of lab results & voiced understanding of those results.

## 2016-03-15 ENCOUNTER — Other Ambulatory Visit: Payer: Self-pay | Admitting: Physician Assistant

## 2016-03-15 DIAGNOSIS — F909 Attention-deficit hyperactivity disorder, unspecified type: Secondary | ICD-10-CM

## 2016-03-15 MED ORDER — LISDEXAMFETAMINE DIMESYLATE 30 MG PO CAPS: 30.0000 mg | ORAL_CAPSULE | Freq: Every day | ORAL | 0 refills | Status: DC

## 2016-03-27 ENCOUNTER — Ambulatory Visit (INDEPENDENT_AMBULATORY_CARE_PROVIDER_SITE_OTHER): Payer: 59 | Admitting: Physician Assistant

## 2016-03-27 ENCOUNTER — Encounter: Payer: Self-pay | Admitting: Physician Assistant

## 2016-03-27 VITALS — BP 122/66 | HR 70 | Temp 97.3°F | Resp 16 | Ht 64.0 in | Wt 158.2 lb

## 2016-03-27 DIAGNOSIS — R319 Hematuria, unspecified: Secondary | ICD-10-CM | POA: Diagnosis not present

## 2016-03-27 LAB — CBC WITH DIFFERENTIAL/PLATELET
BASOS ABS: 0 {cells}/uL (ref 0–200)
Basophils Relative: 0 %
EOS ABS: 78 {cells}/uL (ref 15–500)
Eosinophils Relative: 1 %
HCT: 39.2 % (ref 35.0–45.0)
Hemoglobin: 13.3 g/dL (ref 11.7–15.5)
LYMPHS PCT: 21 %
Lymphs Abs: 1638 cells/uL (ref 850–3900)
MCH: 31.7 pg (ref 27.0–33.0)
MCHC: 33.9 g/dL (ref 32.0–36.0)
MCV: 93.6 fL (ref 80.0–100.0)
MONOS PCT: 7 %
MPV: 10.2 fL (ref 7.5–12.5)
Monocytes Absolute: 546 cells/uL (ref 200–950)
Neutro Abs: 5538 cells/uL (ref 1500–7800)
Neutrophils Relative %: 71 %
Platelets: 325 10*3/uL (ref 140–400)
RBC: 4.19 MIL/uL (ref 3.80–5.10)
RDW: 12.8 % (ref 11.0–15.0)
WBC: 7.8 10*3/uL (ref 3.8–10.8)

## 2016-03-27 LAB — BASIC METABOLIC PANEL WITH GFR
BUN: 15 mg/dL (ref 7–25)
CALCIUM: 8.7 mg/dL (ref 8.6–10.2)
CO2: 25 mmol/L (ref 20–31)
CREATININE: 0.7 mg/dL (ref 0.50–1.10)
Chloride: 101 mmol/L (ref 98–110)
GFR, Est Non African American: >89 mL/min (ref >=60)
Glucose, Bld: 95 mg/dL (ref 65–99)
Potassium: 3.9 mmol/L (ref 3.5–5.3)
Sodium: 136 mmol/L (ref 135–146)

## 2016-03-27 MED ORDER — KETOROLAC TROMETHAMINE 60 MG/2ML IM SOLN
60.0000 mg | Freq: Once | INTRAMUSCULAR | Status: AC
  Administered 2016-03-27: 60 mg via INTRAMUSCULAR

## 2016-03-27 MED ORDER — PHENAZOPYRIDINE HCL 200 MG PO TABS: 200.0000 mg | ORAL_TABLET | Freq: Three times a day (TID) | ORAL | 0 refills | Status: DC | PRN

## 2016-03-27 MED ORDER — CEFTRIAXONE SODIUM 500 MG IJ SOLR
500.0000 mg | Freq: Once | INTRAMUSCULAR | Status: AC
  Administered 2016-03-27: 500 mg via INTRAMUSCULAR

## 2016-03-27 MED ORDER — CIPROFLOXACIN HCL 500 MG PO TABS: 500.0000 mg | ORAL_TABLET | Freq: Two times a day (BID) | ORAL | 0 refills | Status: DC

## 2016-03-27 NOTE — Progress Notes (Signed)
Subjective:    Patient ID: Mackenzie Curry, female    DOB: [birth year], 31 y.o.   MRN: [id number]  HPI 31 y.o. WF presents with hematuria.  She has had symptoms x 2 days, hematuria, fever, vomiting had IV and zofran last night, has suprapubic pressure, bilateral back pain, having frequency but with very little pain. No new partners, not sexually active at this time, no chance pregnancy, no vaginal discharge.   Blood pressure 122/66, pulse 70, temperature 97.3 F (36.3 C), resp. rate 16, height 5\' 4"  (1.626 m), weight 158 lb 3.2 oz (71.8 kg), last menstrual period 03/16/2016, SpO2 99 %.  Medications Current Outpatient Prescriptions on File Prior to Visit  Medication Sig  . ALPRAZolam (XANAX) 0.5 MG tablet TAKE 1/2 TO 1 TABLET TWICE DAILY AS NEEDED  . amphetamine-dextroamphetamine (ADDERALL XR) 10 MG 24 hr capsule Take 1 capsule (10 mg total) by mouth daily.  [name] buPROPion (WELLBUTRIN XL) 150 MG 24 hr tablet Take 1 tablet (150 mg total) by mouth daily.  . Ciclopirox 1 % shampoo Apply 1 Applicatorful topically at bedtime.  . clobetasol (TEMOVATE) 0.05 % external solution Apply 1 application topically 2 (two) times daily.  [name] lisdexamfetamine (VYVANSE) 30 MG capsule Take 1 capsule (30 mg total) by mouth daily.   No current facility-administered medications on file prior to visit.     Problem list She has Depression; IBS (irritable bowel syndrome); Anxiety; Allergy; and History of tobacco abuse on her problem list.  Review of Systems  Constitutional: Positive for chills and fever.  HENT: Negative.   Respiratory: Negative.   Cardiovascular: Negative.   Gastrointestinal: Positive for nausea. Negative for vomiting.  Genitourinary: Positive for decreased urine volume, dysuria, flank pain, frequency, hematuria and urgency. Negative for difficulty urinating, dyspareunia, enuresis, genital sores, menstrual problem, pelvic pain, vaginal bleeding, vaginal discharge and vaginal pain.   Musculoskeletal: Positive for back pain.       Objective:   Physical Exam  Constitutional: She is oriented to person, place, and time. She appears well-developed and well-nourished.  Neck: Normal range of motion. Neck supple.  Cardiovascular: Normal rate and regular rhythm.   Pulmonary/Chest: Effort normal and breath sounds normal.  Abdominal: Soft. Bowel sounds are normal. She exhibits no distension and no mass. There is tenderness (suprapubic). There is no rebound and no guarding.  Musculoskeletal: Normal range of motion. She exhibits tenderness (right CVA tenderness).  Neurological: She is alert and oriented to person, place, and time.  Skin: Skin is warm and dry.     Assessment & Plan:   Hematuria, unspecified type Rule out kidney stone/pyelo, will do IM rocephin, CT AB If any worsening pain go to the ER -     ciprofloxacin (CIPRO) 500 MG tablet; Take 1 tablet (500 mg total) by mouth 2 (two) times daily. 7 days -     Urinalysis, Routine w reflex microscopic -     Urine culture -     CBC with Differential/Platelet -     BASIC METABOLIC PANEL WITH GFR -     ketorolac (TORADOL) injection 60 mg; Inject 2 mLs (60 mg total) into the muscle once. -     phenazopyridine (PYRIDIUM) 200 MG tablet; Take 1 tablet (200 mg total) by mouth 3 (three) times daily as needed for pain. For 2 days.  Take after meals. -     CT Abdomen Pelvis Wo Contrast; Future -     cefTRIAXone (ROCEPHIN) injection 500 mg; Inject 500 mg into the  muscle once.

## 2016-03-28 LAB — URINALYSIS, ROUTINE W REFLEX MICROSCOPIC

## 2016-03-28 NOTE — Progress Notes (Signed)
Pt aware of lab results & voiced understanding of those results.

## 2016-03-28 NOTE — Progress Notes (Signed)
Pt states she would like to hold off on the MRI at least until she get the results back from her U/C, she just doesn't feel like its a kidney stone per pt.

## 2016-03-28 NOTE — Progress Notes (Signed)
Pt states she feels a lot better from yesterday's visit & thanked you for all your help as well.

## 2016-03-30 LAB — URINE CULTURE

## 2016-04-30 ENCOUNTER — Telehealth: Payer: Self-pay | Admitting: Physician Assistant

## 2016-04-30 DIAGNOSIS — F909 Attention-deficit hyperactivity disorder, unspecified type: Secondary | ICD-10-CM

## 2016-04-30 MED ORDER — LISDEXAMFETAMINE DIMESYLATE 30 MG PO CAPS: 30.0000 mg | ORAL_CAPSULE | Freq: Every day | ORAL | 0 refills | Status: DC

## 2016-04-30 NOTE — Telephone Encounter (Signed)
-----   Message from Elenor Quinones, [place] sent at 04/30/2016 10:07 AM EDT ----- Regarding: VYVANCE refill Contact: [phone] Pt would like a refill on VYVANCE.

## 2016-05-11 ENCOUNTER — Ambulatory Visit: Payer: Self-pay | Admitting: Physician Assistant

## 2016-06-14 ENCOUNTER — Other Ambulatory Visit: Payer: Self-pay | Admitting: *Deleted

## 2016-06-14 DIAGNOSIS — F909 Attention-deficit hyperactivity disorder, unspecified type: Secondary | ICD-10-CM

## 2016-06-14 MED ORDER — LISDEXAMFETAMINE DIMESYLATE 30 MG PO CAPS: 30.0000 mg | ORAL_CAPSULE | Freq: Every day | ORAL | 0 refills | Status: DC

## 2016-08-06 ENCOUNTER — Other Ambulatory Visit: Payer: Self-pay | Admitting: Physician Assistant

## 2016-08-06 DIAGNOSIS — F909 Attention-deficit hyperactivity disorder, unspecified type: Secondary | ICD-10-CM

## 2016-08-06 MED ORDER — LISDEXAMFETAMINE DIMESYLATE 30 MG PO CAPS: 30.0000 mg | ORAL_CAPSULE | Freq: Every day | ORAL | 0 refills | Status: DC

## 2016-08-06 NOTE — Progress Notes (Signed)
Future Appointments  Date Time Provider Department Center  02/07/2017  9:00 AM Letetia Romanello, PA-C [facility] None    

## 2016-09-10 ENCOUNTER — Other Ambulatory Visit: Payer: Self-pay | Admitting: Internal Medicine

## 2016-09-10 DIAGNOSIS — F909 Attention-deficit hyperactivity disorder, unspecified type: Secondary | ICD-10-CM

## 2016-09-10 MED ORDER — LISDEXAMFETAMINE DIMESYLATE 30 MG PO CAPS: 30.0000 mg | ORAL_CAPSULE | Freq: Every day | ORAL | 0 refills | Status: DC

## 2016-09-21 ENCOUNTER — Other Ambulatory Visit: Payer: Self-pay

## 2016-09-21 MED ORDER — BUPROPION HCL ER (XL) 150 MG PO TB24: 150.0000 mg | ORAL_TABLET | Freq: Every day | ORAL | 0 refills | Status: DC

## 2016-09-26 ENCOUNTER — Encounter: Payer: Self-pay | Admitting: Physician Assistant

## 2016-09-26 ENCOUNTER — Ambulatory Visit (INDEPENDENT_AMBULATORY_CARE_PROVIDER_SITE_OTHER): Payer: 59 | Admitting: Physician Assistant

## 2016-09-26 VITALS — BP 126/84 | HR 100 | Temp 97.5°F | Resp 18 | Ht 64.0 in | Wt 161.0 lb

## 2016-09-26 DIAGNOSIS — E559 Vitamin D deficiency, unspecified: Secondary | ICD-10-CM | POA: Diagnosis not present

## 2016-09-26 DIAGNOSIS — R1012 Left upper quadrant pain: Secondary | ICD-10-CM | POA: Diagnosis not present

## 2016-09-26 DIAGNOSIS — F329 Major depressive disorder, single episode, unspecified: Secondary | ICD-10-CM | POA: Diagnosis not present

## 2016-09-26 DIAGNOSIS — Z79899 Other long term (current) drug therapy: Secondary | ICD-10-CM

## 2016-09-26 DIAGNOSIS — F909 Attention-deficit hyperactivity disorder, unspecified type: Secondary | ICD-10-CM

## 2016-09-26 DIAGNOSIS — F419 Anxiety disorder, unspecified: Secondary | ICD-10-CM | POA: Diagnosis not present

## 2016-09-26 DIAGNOSIS — F17200 Nicotine dependence, unspecified, uncomplicated: Secondary | ICD-10-CM | POA: Diagnosis not present

## 2016-09-26 DIAGNOSIS — F32A Depression, unspecified: Secondary | ICD-10-CM

## 2016-09-26 LAB — TSH: TSH: 0.98 mIU/L

## 2016-09-26 LAB — CBC WITH DIFFERENTIAL/PLATELET
BASOS ABS: 51 {cells}/uL (ref 0–200)
BASOS PCT: 0.5 %
EOS ABS: 51 {cells}/uL (ref 15–500)
Eosinophils Relative: 0.5 %
HCT: 36.4 % (ref 35.0–45.0)
Hemoglobin: 12.4 g/dL (ref 11.7–15.5)
LYMPHS ABS: 1958 {cells}/uL (ref 850–3900)
MCH: 30.8 pg (ref 27.0–33.0)
MCHC: 34.1 g/dL (ref 32.0–36.0)
MCV: 90.5 fL (ref 80.0–100.0)
MPV: 10.9 fL (ref 7.5–12.5)
Monocytes Relative: 8.5 %
NEUTROS ABS: 7273 {cells}/uL (ref 1500–7800)
Neutrophils Relative %: 71.3 %
Platelets: 310 10*3/uL (ref 140–400)
RBC: 4.02 10*6/uL (ref 3.80–5.10)
RDW: 12 % (ref 11.0–15.0)
Total Lymphocyte: 19.2 %
WBC: 10.2 10*3/uL (ref 3.8–10.8)
WBCMIX: 867 {cells}/uL (ref 200–950)

## 2016-09-26 LAB — HEPATIC FUNCTION PANEL
AG Ratio: 1.8 (calc) (ref 1.0–2.5)
ALKALINE PHOSPHATASE (APISO): 64 U/L (ref 33–115)
ALT: 12 U/L (ref 6–29)
AST: 12 U/L (ref 10–30)
Albumin: 4.4 g/dL (ref 3.6–5.1)
BILIRUBIN TOTAL: 0.3 mg/dL (ref 0.2–1.2)
Bilirubin, Direct: 0.1 mg/dL (ref 0.0–0.2)
Globulin: 2.4 g/dL (calc) (ref 1.9–3.7)
Indirect Bilirubin: 0.2 mg/dL (calc) (ref 0.2–1.2)
TOTAL PROTEIN: 6.8 g/dL (ref 6.1–8.1)

## 2016-09-26 LAB — BASIC METABOLIC PANEL WITH GFR
BUN: 14 mg/dL (ref 7–25)
CO2: 25 mmol/L (ref 20–32)
Calcium: 8.9 mg/dL (ref 8.6–10.2)
Chloride: 102 mmol/L (ref 98–110)
Creat: 0.79 mg/dL (ref 0.50–1.10)
GFR, EST NON AFRICAN AMERICAN: 100 mL/min/{1.73_m2} (ref >=60)
GFR, Est African American: 116 mL/min/{1.73_m2} (ref >=60)
GLUCOSE: 74 mg/dL (ref 65–99)
POTASSIUM: 3.9 mmol/L (ref 3.5–5.3)
SODIUM: 137 mmol/L (ref 135–146)

## 2016-09-26 MED ORDER — BUPROPION HCL ER (XL) 300 MG PO TB24: 300.0000 mg | ORAL_TABLET | Freq: Every day | ORAL | 1 refills | Status: DC

## 2016-09-26 MED ORDER — LISDEXAMFETAMINE DIMESYLATE 50 MG PO CAPS: 50.0000 mg | ORAL_CAPSULE | Freq: Every day | ORAL | 0 refills | Status: DC
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
## 2016-09-26 NOTE — Patient Instructions (Signed)
Take omeprazole over the counter for 2 weeks, then go to zantac 150-300 mg at night for 2 weeks, then you can stop.  Avoid alcohol, spicy foods, NSAIDS (aleve, ibuprofen) at this time. See foods below.   Food Choices for Gastroesophageal Reflux Disease When you have gastroesophageal reflux disease (GERD), the foods you eat and your eating habits are very important. Choosing the right foods can help ease the discomfort of GERD. WHAT GENERAL GUIDELINES DO I NEED TO FOLLOW?  Choose fruits, vegetables, whole grains, low-fat dairy products, and low-fat meat, fish, and poultry.  Limit fats such as oils, salad dressings, butter, nuts, and avocado.  Keep a food diary to identify foods that cause symptoms.  Avoid foods that cause reflux. These may be different for different people.  Eat frequent small meals instead of three large meals each day.  Eat your meals slowly, in a relaxed setting.  Limit fried foods.  Cook foods using methods other than frying.  Avoid drinking alcohol.  Avoid drinking large amounts of liquids with your meals.  Avoid bending over or lying down until 2-3 hours after eating. WHAT FOODS ARE NOT RECOMMENDED? The following are some foods and drinks that may worsen your symptoms: Vegetables Tomatoes. Tomato juice. Tomato and spaghetti sauce. Chili peppers. Onion and garlic. Horseradish. Fruits Oranges, grapefruit, and lemon (fruit and juice). Meats High-fat meats, fish, and poultry. This includes hot dogs, ribs, ham, sausage, salami, and bacon. Dairy Whole milk and chocolate milk. Sour cream. Cream. Butter. Ice cream. Cream cheese.  Beverages Coffee and tea, with or without caffeine. Carbonated beverages or energy drinks. Condiments Hot sauce. Barbecue sauce.  Sweets/Desserts Chocolate and cocoa. Donuts. Peppermint and spearmint. Fats and Oils High-fat foods, including [place] fries and potato chips. Other Vinegar. Strong spices, such as black pepper, white  pepper, red pepper, cayenne, curry powder, cloves, ginger, and chili powder.

## 2016-10-02 NOTE — Progress Notes (Signed)
Pt aware of lab results & voiced understanding of those results.

## 2016-11-02 ENCOUNTER — Ambulatory Visit: Payer: 59 | Admitting: Podiatry

## 2016-11-05 ENCOUNTER — Other Ambulatory Visit: Payer: Self-pay | Admitting: Physician Assistant

## 2016-11-05 DIAGNOSIS — F909 Attention-deficit hyperactivity disorder, unspecified type: Secondary | ICD-10-CM

## 2016-11-05 MED ORDER — LISDEXAMFETAMINE DIMESYLATE 50 MG PO CAPS: 50.0000 mg | ORAL_CAPSULE | Freq: Every day | ORAL | 0 refills | Status: DC

## 2016-11-05 NOTE — Progress Notes (Signed)
Future Appointments  Date Time Provider [place]  02/07/2017  9:00 AM Vicie Mutters, PA-C [facility] None

## 2016-12-20 ENCOUNTER — Encounter: Payer: Self-pay | Admitting: Physician Assistant

## 2016-12-20 ENCOUNTER — Ambulatory Visit: Payer: 59 | Admitting: Physician Assistant

## 2016-12-20 VITALS — BP 122/80 | HR 66 | Temp 97.8°F | Resp 14 | Ht 64.0 in | Wt 160.6 lb

## 2016-12-20 DIAGNOSIS — F419 Anxiety disorder, unspecified: Secondary | ICD-10-CM

## 2016-12-20 DIAGNOSIS — F329 Major depressive disorder, single episode, unspecified: Secondary | ICD-10-CM

## 2016-12-20 DIAGNOSIS — F17200 Nicotine dependence, unspecified, uncomplicated: Secondary | ICD-10-CM | POA: Diagnosis not present

## 2016-12-20 DIAGNOSIS — F32A Depression, unspecified: Secondary | ICD-10-CM

## 2016-12-20 MED ORDER — SERTRALINE HCL 50 MG PO TABS: 50.0000 mg | ORAL_TABLET | Freq: Every day | ORAL | 2 refills | Status: DC
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
## 2016-12-20 NOTE — Patient Instructions (Addendum)
Stop the vyvanse Stop the wellbutrin Start zoloft 50mg  daily for PTSD and anxiety Strongly suggest calling your insurance and seeing which psych is under your insurance Call and make an appointment   Living With Post-Traumatic Stress Disorder If you have been diagnosed with post-traumatic stress disorder (PTSD), you may be relieved that you now know why you have felt or behaved a certain way. Still, you may feel overwhelmed about the treatment ahead. You may also wonder how to get the support you need and how to deal with the condition day-to-day. If you are living with PTSD, there are ways to help you recover from it and manage your symptoms. How to manage lifestyle changes Managing stress Stress is your body's reaction to life changes and events, both good and bad. Stress can make PTSD worse. Take the following steps to cope with stress:  Talk with your health care provider or a counselor if you would like to learn more about techniques to reduce your stress. He or she may suggest some stress reduction techniques such as: ? Muscle relaxation exercises. ? Regular exercise. ? Meditation, yoga, or other mind-body exercises. ? Breathing exercises. ? Listening to quiet music. ? Spending time outside.  Maintain a healthy lifestyle. Eat a healthy diet, exercise regularly, get plenty of sleep, and take time to relax.  Spend time with others. Talk with them about how you are feeling and what kind of support you need. Try to not isolate yourself, even though you may feel like doing that. Isolating yourself can delay your recovery.  Do activities and hobbies that you enjoy.  Pace yourself when doing stressful things. Take breaks, and reward yourself when you finish. Make sure that you do not overload your schedule.  Medicines Your health care provider may suggest certain medicines if he or she feels that they will help to improve your condition. Antidepressants or antipsychotic medicines may be  used to treat PTSD. Avoid using alcohol and other substances that may prevent your medicines from working properly (may interact). It is also important to:  Talk with your pharmacist or health care provider about all medicines that you take, their possible side effects, and which medicines are safe to take together.  Make it your goal to take part in all treatment decisions (shared decision-making). Ask about possible side effects of medicines that your health care provider recommends, and tell him or her how you feel about having those side effects. It is best if shared decision-making with your health care provider is part of your total treatment plan.  If your health care provider prescribes a medicine, you may not notice the full benefits of it for 4-8 weeks. Most people who are treated for PTSD need to take medicine for at least 6-12 months after they feel better. If you are taking medicines as part of your treatment, do not stop taking medicines before you ask your health care provider if it is safe to stop. You may need to have the medicine slowly decreased (tapered) over time to lower the risk of harmful side effects. Relationships Many people who have PTSD have difficulty trusting others. Make an effort to:  Take risks and develop trust with close friends and family members. Developing trust in others can help you feel safe and connect you with emotional support.  Be open and honest about your feelings.  Try to have fun and relax in safe spaces, such as with friends and family.  Think about going to couples counseling, family  education classes, or family therapy. Your loved ones may not always know how to be supportive. Therapy can be helpful for everyone.  How to recognize changes in your condition Be aware of your symptoms and how often you have them. The following symptoms mean that you need to seek help for your PTSD:  You feel suspicious and angry.  You have repeated  flashbacks.  You avoid going out or being with others.  You have an increasing number of fights with close friends or family members, such as your spouse.  You have thoughts about hurting yourself or others.  You cannot get relief from feelings of depression or anxiety.  Where to find support Talking to others  Explain that PTSD is a mental health problem. It is something that a person can develop after experiencing or seeing a life-threatening event. Tell them that PTSD makes you feel stress like you did during the event.  Talk to your loved ones about the symptoms you have. Also tell them what things or situations can cause symptoms to start (are triggers for you).  Assure your loved ones that there are treatments to help PTSD. Discuss possibly seeking family therapy or couples therapy.  If you are worried or fearful about seeking treatment, ask for support. Finances Not all insurance plans cover mental health care, so it is important to check with your insurance carrier. If paying for co-pays or counseling services is a problem, search for a local or county mental health care center. Public mental health care services may be offered there at a low cost or no cost when you are not able to see a private health care provider. If you are a veteran, contact a local veterans organization or veterans hospital for more information. If you are taking medicine for PTSD, you may be able to get the genericform, which may be less expensive than brand-name medicine. Some makers of prescription medicines also offer help to patients who cannot afford the medicines that they need. Community resources  Find a support group in your community. Often, groups are available for [employer] veterans, trauma victims, and family members or caregivers.  Look into volunteer opportunities. Taking part in these can help you feel more connected to your community.  Contact a local organization to find out if you are  eligible for a service dog.  Keep daily contact with at least one trusted friend or family member. Follow these instructions at home: Lifestyle  Exercise regularly. Try to do 30 or more minutes of physical activity on most days of the week.  Try to get 7-9 hours of sleep each night. To help with sleep: ? Keep your bedroom cool and dark. ? Do not eat a heavy meal during the hour before you go to bed. ? Do not drink alcohol or caffeinated drinks before bed. ? Avoid screen time before bedtime. This means avoiding use of your TV, computer, tablet, and cell phone.  Avoid using alcohol or drugs.  Practice self-soothing skills and use them daily.  Try to have fun and seek humor in your life. General instructions  If your PTSD is affecting your marriage or family, seek help from a family therapist.  Take over-the-counter and prescription medicines only as told by your health care provider.  Make sure to let all of your health care providers know that you have PTSD. This is especially important if you are having surgery or need to be admitted to the hospital.  Keep all follow-up visits as  told by your health care providers. This is important. Where to find more information: Go to this website to find more information about PTSD, treatment for PTSD, and how to get support:  [facility] for PTSD: www.ptsd.PaintballBuzz.cz  Contact a health care provider if:  Your symptoms get worse or they do not get better. Get help right away if:  You have thoughts about hurting yourself or others. If you ever feel like you may hurt yourself or others, or have thoughts about taking your own life, get help right away. You can go to your nearest emergency department or call:  Your local emergency services (911 in the [place]  A suicide crisis helpline, such as the [facility] at (407)189-0658. This is open 24-hours a day.  Summary  If you are living with PTSD, there are ways to  help you recover from it and manage your symptoms.  Find supportive environments and people who understand PTSD. Spend time in those places, and maintain contact with those people.  Work with your health care team to create a management plan that includes counseling, stress management techniques, and healthy lifestyle habits. This information is not intended to replace advice given to you by your health care provider. Make sure you discuss any questions you have with your health care provider. Document Released: 04/19/2016 Document Revised: 04/19/2016 Document Reviewed: 04/19/2016 Elsevier Interactive Patient Education  [employer].

## 2016-12-27 ENCOUNTER — Encounter: Payer: Self-pay | Admitting: Physician Assistant

## 2017-01-06 ENCOUNTER — Other Ambulatory Visit: Payer: Self-pay | Admitting: Physician Assistant

## 2017-01-07 ENCOUNTER — Other Ambulatory Visit: Payer: Self-pay

## 2017-01-07 DIAGNOSIS — L219 Seborrheic dermatitis, unspecified: Secondary | ICD-10-CM | POA: Diagnosis not present

## 2017-01-07 DIAGNOSIS — D229 Melanocytic nevi, unspecified: Secondary | ICD-10-CM | POA: Diagnosis not present

## 2017-01-07 DIAGNOSIS — D492 Neoplasm of unspecified behavior of bone, soft tissue, and skin: Secondary | ICD-10-CM | POA: Diagnosis not present

## 2017-01-17 DIAGNOSIS — M9901 Segmental and somatic dysfunction of cervical region: Secondary | ICD-10-CM | POA: Diagnosis not present

## 2017-01-17 DIAGNOSIS — M9902 Segmental and somatic dysfunction of thoracic region: Secondary | ICD-10-CM | POA: Diagnosis not present

## 2017-01-17 DIAGNOSIS — M542 Cervicalgia: Secondary | ICD-10-CM | POA: Diagnosis not present

## 2017-02-05 ENCOUNTER — Encounter: Payer: Self-pay | Admitting: Physician Assistant

## 2017-02-07 ENCOUNTER — Encounter: Payer: Self-pay | Admitting: Physician Assistant

## 2017-02-08 DIAGNOSIS — Z0000 Encounter for general adult medical examination without abnormal findings: Secondary | ICD-10-CM | POA: Diagnosis not present

## 2017-03-08 DIAGNOSIS — M25532 Pain in left wrist: Secondary | ICD-10-CM | POA: Diagnosis not present

## 2017-03-08 DIAGNOSIS — M9901 Segmental and somatic dysfunction of cervical region: Secondary | ICD-10-CM | POA: Diagnosis not present

## 2017-03-08 DIAGNOSIS — M25512 Pain in left shoulder: Secondary | ICD-10-CM | POA: Diagnosis not present

## 2017-05-09 DIAGNOSIS — Z79899 Other long term (current) drug therapy: Secondary | ICD-10-CM | POA: Diagnosis not present

## 2017-07-17 DIAGNOSIS — E559 Vitamin D deficiency, unspecified: Secondary | ICD-10-CM | POA: Diagnosis not present

## 2017-07-17 DIAGNOSIS — Z79899 Other long term (current) drug therapy: Secondary | ICD-10-CM | POA: Diagnosis not present

## 2017-09-22 DIAGNOSIS — Z79899 Other long term (current) drug therapy: Secondary | ICD-10-CM | POA: Diagnosis not present

## 2017-10-28 DIAGNOSIS — Z79899 Other long term (current) drug therapy: Secondary | ICD-10-CM | POA: Diagnosis not present

## 2017-12-13 DIAGNOSIS — Z79899 Other long term (current) drug therapy: Secondary | ICD-10-CM | POA: Diagnosis not present

## 2018-02-01 DIAGNOSIS — M25559 Pain in unspecified hip: Secondary | ICD-10-CM | POA: Diagnosis not present

## 2018-02-01 DIAGNOSIS — Z79899 Other long term (current) drug therapy: Secondary | ICD-10-CM | POA: Diagnosis not present

## 2018-03-15 ENCOUNTER — Other Ambulatory Visit: Payer: Self-pay

## 2018-03-15 ENCOUNTER — Emergency Department (HOSPITAL_COMMUNITY)
Admission: EM | Admit: 2018-03-15 | Discharge: 2018-03-15 | Disposition: A | Discharge status: Home / Self Care | Payer: No Typology Code available for payment source | Attending: Emergency Medicine | Admitting: Emergency Medicine

## 2018-03-15 ENCOUNTER — Encounter (HOSPITAL_COMMUNITY): Payer: Self-pay | Admitting: *Deleted

## 2018-03-15 DIAGNOSIS — Z0389 Encounter for observation for other suspected diseases and conditions ruled out: Secondary | ICD-10-CM | POA: Diagnosis not present

## 2018-03-15 DIAGNOSIS — Z7721 Contact with and (suspected) exposure to potentially hazardous body fluids: Secondary | ICD-10-CM | POA: Diagnosis not present

## 2018-03-15 LAB — COMPREHENSIVE METABOLIC PANEL
ALBUMIN: 3.9 g/dL (ref 3.5–5.0)
ALT: 20 U/L (ref 0–44)
AST: 17 U/L (ref 15–41)
Alkaline Phosphatase: 69 U/L (ref 38–126)
Anion gap: 6 (ref 5–15)
BUN: 13 mg/dL (ref 6–20)
CO2: 26 mmol/L (ref 22–32)
Calcium: 8.7 mg/dL — ABNORMAL LOW (ref 8.9–10.3)
Chloride: 105 mmol/L (ref 98–111)
Creatinine, Ser: 0.58 mg/dL (ref 0.44–1.00)
GFR calc Af Amer: >60 mL/min (ref >60)
GFR calc non Af Amer: >60 mL/min (ref >60)
Glucose, Bld: 97 mg/dL (ref 70–99)
Potassium: 3.5 mmol/L (ref 3.5–5.1)
SODIUM: 137 mmol/L (ref 135–145)
Total Bilirubin: 0.5 mg/dL (ref 0.3–1.2)
Total Protein: 6.8 g/dL (ref 6.5–8.1)

## 2018-03-15 LAB — RAPID HIV SCREEN (HIV 1/2 AB+AG)
HIV 1/2 Antibodies: NONREACTIVE
HIV-1 P24 ANTIGEN - HIV24: NONREACTIVE

## 2018-03-15 LAB — POC URINE PREG, ED: Preg Test, Ur: NEGATIVE

## 2018-03-15 MED ORDER — ELVITEG-COBIC-EMTRICIT-TENOFAF 150-150-200-10 MG PO TABS
1.0000 | ORAL_TABLET | Freq: Once | ORAL | Status: AC
  Administered 2018-03-15: 1 via ORAL
  Filled 2018-03-15: qty 1

## 2018-03-15 MED ORDER — ELVITEG-COBIC-EMTRICIT-TENOFAF 150-150-200-10 MG PO TABS: 1.0000 | ORAL_TABLET | Freq: Every day | ORAL | 0 refills | Status: DC

## 2018-03-15 NOTE — ED Notes (Signed)
Patient verbalizes understanding of discharge instructions . Opportunity for questions and answers were provided . Armband removed by staff ,Pt discharged from ED. W/C  offered at D/C  and Declined W/C at D/C and was escorted to lobby by RN.  
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
## 2018-03-15 NOTE — Discharge Instructions (Addendum)
You have been seen today for exposure to body fluids. Please read and follow all provided instructions.   1. Medications: usual home medications 2. Treatment: rest, drink plenty of fluids 3. Follow Up: Please call and follow up with occupational health. Please follow up with your primary doctor in 7 days for discussion of your diagnoses and further evaluation after today's visit; if you do not have a primary care doctor use the resource guide provided to find one; Please return to the ER for any new or worsening symptoms. Please obtain all of your results from medical records or have your doctors office obtain the results - share them with your doctor - you should be seen at your doctors office. Call today to arrange your follow up.   Take medications as prescribed. Please review all of the medicines and only take them if you do not have an allergy to them. Return to the emergency room for worsening condition or new concerning symptoms. Follow up with your regular doctor. If you don't have a regular doctor use one of the numbers below to establish a primary care doctor.  Please be aware that if you are taking birth control pills, taking other prescriptions, ESPECIALLY ANTIBIOTICS may make the birth control ineffective - if this is the case, either do not engage in sexual activity or use alternative methods of birth control such as condoms until you have finished the medicine and your family doctor says it is OK to restart them. If you are on a blood thinner such as COUMADIN, be aware that any other medicine that you take may cause the coumadin to either work too much, or not enough - you should have your coumadin level rechecked in next 7 days if this is the case.  ?  It is also a possibility that you have an allergic reaction to any of the medicines that you have been prescribed - Everybody reacts differently to medications and while MOST people have no trouble with most medicines, you may have a reaction  such as nausea, vomiting, rash, swelling, shortness of breath. If this is the case, please stop taking the medicine immediately and contact your physician.  ?  You should return to the ER if you develop severe or worsening symptoms.   Emergency Department Resource Guide 1) Find a Doctor and Pay Out of Pocket Although you won't have to find out who is covered by your insurance plan, it is a good idea to ask around and get recommendations. You will then need to call the office and see if the doctor you have chosen will accept you as a new patient and what types of options they offer for patients who are self-pay. Some doctors offer discounts or will set up payment plans for their patients who do not have insurance, but you will need to ask so you aren't surprised when you get to your appointment.  2) Contact Your Local Health Department Not all health departments have doctors that can see patients for sick visits, but many do, so it is worth a call to see if yours does. If you don't know where your local health department is, you can check in your phone book. The CDC also has a tool to help you locate your state's health department, and many state websites also have listings of all of their local health departments.  3) Find a [facility] If your illness is not likely to be very severe or complicated, you may want to try  a walk in clinic. These are popping up all over the country in pharmacies, drugstores, and shopping centers. They're usually staffed by nurse practitioners or physician assistants that have been trained to treat common illnesses and complaints. They're usually fairly quick and inexpensive. However, if you have serious medical issues or chronic medical problems, these are probably not your best option.  No Primary Care Doctor: Call [facility] at  [phone] - they can help you locate a primary care doctor that  accepts your insurance, provides certain services, etc. Physician  Referral Service- [phone]  Chronic Pain Problems: Organization         Address  Phone   Notes  [facility]  [phone] Patients need to be referred by their primary care doctor.   Medication Assistance: Organization         Address  Phone   Notes  [facility] Medication [facility] [address] [place], [place] [address] [phone] --Must be a resident of [facility] -- Must have NO insurance coverage whatsoever (no Medicaid/ Medicare, etc.) -- The pt. MUST have a primary care doctor that directs their care regularly and follows them in the community   [facility]  [phone]   [employer]  [phone]    Agencies that provide inexpensive medical care: Organization         Address  Phone   Notes  [place]  [phone]   [facility]    [phone]   [facility] [place], [place] [address] [phone]   [place] [address], [place] [phone]   [facility]    [phone]   [facility]    [phone]   [place] and [place] [address], [place] Phone:  [phone], Fax:  [phone] Hours of Operation:  9 am - 6 pm, M-F.  Also accepts Medicaid/Medicare and self-pay.  [facility] for [address], [address], [place] Phone: [phone], Fax: [phone]. Hours of Operation:  8:30 am - 5:30 pm, M-F.  Also accepts Medicaid and self-pay.  [facility] [address] [place] Phone: [phone]   [place], [place], [place] [phone], Ext. [phone] Mondays & Thursdays: 7-9 AM.  First 15 patients are seen on a first come, first serve basis.    [place] Providers:  Organization         Address  Phone   Notes  [facility] [address], [place] [phone] Also accepts self-pay patients.  [facility] [address], [place]  [phone]   [place], [address], [place] [phone]   [facility] [address] [place] [phone]   Lucianne Lei [address], [place]   [phone] Only accepts [place] [facility] [place] patients after they have their name applied to their card.   Self-Pay (no insurance) in [facility]:  Organization         Address  Phone   Notes  Sickle Cell Patients, [facility] [place] [phone]   [facility] [place] [phone]   [facility] [place]  [place], [address], [place] [phone]   [facility]/Dr. Osei-Bonsu  [address] [place] or [address], [place] [phone] Phone number for both [place] and [place] locations is the same.  Urgent Medical and [facility] [address], [place] [phone]   [facility] [address] [place] or [address] [phone]   [facility] [address] [place] [phone], phone; [phone], fax Sees patients 1st and 3rd Saturday of every month.  Must not qualify for public or private insurance (i.e. Medicaid, Medicare, [place] Health Choice, Veterans' Benefits)  Household income should be no more than 200% of the poverty level The clinic cannot treat you if you are pregnant or think you are pregnant  Sexually transmitted diseases are not treated at the clinic.

## 2018-03-16 LAB — RPR: RPR Ser Ql: NONREACTIVE

## 2018-03-17 LAB — HEPATITIS PANEL, ACUTE
HEP A IGM: NEGATIVE
Hep B C IgM: NEGATIVE
Hepatitis B Surface Ag: NEGATIVE

## 2018-05-22 DIAGNOSIS — Z79899 Other long term (current) drug therapy: Secondary | ICD-10-CM | POA: Diagnosis not present

## 2019-05-22 ENCOUNTER — Emergency Department (HOSPITAL_BASED_OUTPATIENT_CLINIC_OR_DEPARTMENT_OTHER)
Admission: EM | Admit: 2019-05-22 | Discharge: 2019-05-22 | Disposition: A | Discharge status: Home / Self Care | Payer: 59 | Attending: Emergency Medicine | Admitting: Emergency Medicine

## 2019-05-22 ENCOUNTER — Other Ambulatory Visit: Payer: Self-pay

## 2019-05-22 ENCOUNTER — Encounter (HOSPITAL_BASED_OUTPATIENT_CLINIC_OR_DEPARTMENT_OTHER): Payer: Self-pay

## 2019-05-22 ENCOUNTER — Emergency Department (HOSPITAL_BASED_OUTPATIENT_CLINIC_OR_DEPARTMENT_OTHER): Payer: 59

## 2019-05-22 DIAGNOSIS — R2241 Localized swelling, mass and lump, right lower limb: Secondary | ICD-10-CM | POA: Insufficient documentation

## 2019-05-22 DIAGNOSIS — R5383 Other fatigue: Secondary | ICD-10-CM | POA: Insufficient documentation

## 2019-05-22 DIAGNOSIS — Z79899 Other long term (current) drug therapy: Secondary | ICD-10-CM | POA: Insufficient documentation

## 2019-05-22 DIAGNOSIS — R0602 Shortness of breath: Secondary | ICD-10-CM | POA: Insufficient documentation

## 2019-05-22 DIAGNOSIS — Z87891 Personal history of nicotine dependence: Secondary | ICD-10-CM | POA: Diagnosis not present

## 2019-05-22 DIAGNOSIS — M79622 Pain in left upper arm: Secondary | ICD-10-CM | POA: Diagnosis not present

## 2019-05-22 LAB — COMPREHENSIVE METABOLIC PANEL
ALT: 19 U/L (ref 0–44)
AST: 13 U/L — ABNORMAL LOW (ref 15–41)
Albumin: 4.2 g/dL (ref 3.5–5.0)
Alkaline Phosphatase: 65 U/L (ref 38–126)
Anion gap: 9 (ref 5–15)
BUN: 14 mg/dL (ref 6–20)
CO2: 26 mmol/L (ref 22–32)
Calcium: 8.7 mg/dL — ABNORMAL LOW (ref 8.9–10.3)
Chloride: 104 mmol/L (ref 98–111)
Creatinine, Ser: 0.73 mg/dL (ref 0.44–1.00)
GFR calc Af Amer: >60 mL/min (ref >60)
GFR calc non Af Amer: >60 mL/min (ref >60)
Glucose, Bld: 96 mg/dL (ref 70–99)
Potassium: 3.5 mmol/L (ref 3.5–5.1)
Sodium: 139 mmol/L (ref 135–145)
Total Bilirubin: 0.7 mg/dL (ref 0.3–1.2)
Total Protein: 7.3 g/dL (ref 6.5–8.1)

## 2019-05-22 LAB — T4, FREE: Free T4: 1 ng/dL (ref 0.61–1.12)

## 2019-05-22 LAB — CBC WITH DIFFERENTIAL/PLATELET
Abs Immature Granulocytes: 0.02 10*3/uL (ref 0.00–0.07)
Basophils Absolute: 0.1 10*3/uL (ref 0.0–0.1)
Basophils Relative: 1 %
Eosinophils Absolute: 0.1 10*3/uL (ref 0.0–0.5)
Eosinophils Relative: 1 %
HCT: 37 % (ref 36.0–46.0)
Hemoglobin: 12.8 g/dL (ref 12.0–15.0)
Immature Granulocytes: 0 %
Lymphocytes Relative: 26 %
Lymphs Abs: 2.1 10*3/uL (ref 0.7–4.0)
MCH: 31.4 pg (ref 26.0–34.0)
MCHC: 34.6 g/dL (ref 30.0–36.0)
MCV: 90.7 fL (ref 80.0–100.0)
Monocytes Absolute: 0.8 10*3/uL (ref 0.1–1.0)
Monocytes Relative: 10 %
Neutro Abs: 4.9 10*3/uL (ref 1.7–7.7)
Neutrophils Relative %: 62 %
Platelets: 302 10*3/uL (ref 150–400)
RBC: 4.08 MIL/uL (ref 3.87–5.11)
RDW: 12.1 % (ref 11.5–15.5)
WBC: 8 10*3/uL (ref 4.0–10.5)
nRBC: 0 % (ref 0.0–0.2)

## 2019-05-22 LAB — URINALYSIS, ROUTINE W REFLEX MICROSCOPIC
Bilirubin Urine: NEGATIVE
Glucose, UA: NEGATIVE mg/dL
Hgb urine dipstick: NEGATIVE
Ketones, ur: NEGATIVE mg/dL
Nitrite: NEGATIVE
Protein, ur: NEGATIVE mg/dL
Specific Gravity, Urine: >1.03 — ABNORMAL HIGH (ref 1.005–1.030)
pH: 6 (ref 5.0–8.0)

## 2019-05-22 LAB — TROPONIN I (HIGH SENSITIVITY)
Troponin I (High Sensitivity): <2 ng/L (ref <18)
Troponin I (High Sensitivity): <2 ng/L (ref <18)

## 2019-05-22 LAB — PREGNANCY, URINE: Preg Test, Ur: NEGATIVE

## 2019-05-22 LAB — URINALYSIS, MICROSCOPIC (REFLEX): RBC / HPF: NONE SEEN RBC/hpf (ref 0–5)

## 2019-05-22 LAB — TSH: TSH: 1.058 u[IU]/mL (ref 0.350–4.500)

## 2019-05-22 LAB — BRAIN NATRIURETIC PEPTIDE: B Natriuretic Peptide: 18.7 pg/mL (ref 0.0–100.0)

## 2019-05-22 MED ORDER — IOHEXOL 350 MG/ML SOLN
100.0000 mL | Freq: Once | INTRAVENOUS | Status: AC | PRN
  Administered 2019-05-22: 100 mL via INTRAVENOUS

## 2019-05-22 NOTE — ED Provider Notes (Signed)
[facility] EMERGENCY DEPARTMENT Provider Note   CSN: [id number] Arrival date & time: 05/22/19  1314     History Chief Complaint  Patient presents with  . Fatigue    Mackenzie Curry is a 34 y.o. female.  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  Not feeling "right" for the last few weeks. Typically very active.  Shortness of breath progressive over the last 2.5wk, on vyvanse, exertional shortness of breath after staring the vyvanse. Sees Bethany PCP. Hasn't seen OB for last 24yr, has been putting off maintenance appointments.  Had melanoma previously diagnosed, no spread, localized to mole.  RLE swelling for 2.5 weeks  Started 2 days after tactical medic try out, was dehydrated almost like rhabdo-had vomiting after that, since then things feel like they have fallen apart, worried about having ascites, urine output off, throughout this time was having pinpoint pain on scapula-size of time, only for a few months, saw black dot under axilla, had mole and palpated and felt like it was tender throughout, pain under axilla Last night both hands swollen Concerned holding fluid  No chest pain Nausea last night, no vomiting, no diaphoresis, no fevers, warmth under axilla, no abdominal pain, no constipation diarrhea is similar to chronic hx of IBS Feels like generalized puffiness/swelling all over   Past Medical History:  Diagnosis Date  . ADD (attention deficit disorder)   . Allergy   . Anxiety   . Depression   . IBS (irritable bowel syndrome)   . No pertinent past medical history   . PONV (postoperative nausea and vomiting)     Patient Active Problem List   Diagnosis Date Noted  . History of tobacco abuse 12/22/2014  . Depression   . IBS  (irritable bowel syndrome)   . Anxiety   . Allergy     Past Surgical History:  Procedure Laterality Date  . ANTERIOR CRUCIATE LIGAMENT REPAIR       OB History    Gravida  1   Para  1   Term  0   Preterm  1   AB  0   Living  1     SAB  0   TAB  0   Ectopic  0   Multiple  0   Live Births  1           Family History  Problem Relation Age of Onset  . Hypertension Mother   . Hypertension Father   . Anesthesia problems Neg Hx   . Hypotension Neg Hx   . Malignant hyperthermia Neg Hx   . Pseudochol deficiency Neg Hx     Social History   Tobacco Use  . Smoking status: Former Smoker    Quit date: 08/06/2015    Years since quitting: 3.7  . Smokeless tobacco: Never Used  Substance Use Topics  . Alcohol use: Yes    Comment: occ  . Drug use: No    Home Medications Prior to Admission medications   Medication Sig Start Date End Date Taking? Authorizing Provider  buPROPion (WELLBUTRIN XL) 300 MG 24 hr tablet TAKE 1 TABLET BY MOUTH EVERY DAY 01/06/17   Unk Pinto, MD  Ciclopirox 1 % shampoo Apply 1 Applicatorful topically at bedtime. 02/07/16   Vicie Mutters, PA-C  clobetasol (TEMOVATE) 0.05 % external solution Apply 1 application topically 2 (two) times daily. 02/07/16   Vicie Mutters, PA-C  elvitegravir-cobicistat-emtricitabine-tenofovir (GENVOYA) 150-150-200-10 MG TABS tablet Take 1 tablet by mouth daily with breakfast. 03/15/18   Darlin Drop P, PA-C  lisdexamfetamine (VYVANSE) 50 MG capsule Take 1 capsule (50 mg total) daily by mouth. 11/05/16   Vicie Mutters, PA-C  phenazopyridine (PYRIDIUM) 200 MG tablet Take 1 tablet (200 mg total) by mouth 3 (three) times daily as needed for pain. For 2 days.  Take after meals. 03/27/16   Vicie Mutters, PA-C  sertraline (ZOLOFT) 50 MG tablet Take 1 tablet (50 mg total) by mouth daily. 12/20/16 12/20/17  Vicie Mutters, PA-C    Allergies    Sulfa antibiotics  Review of Systems   Review of Systems   Constitutional: Positive for fatigue. Negative for fever.  HENT: Negative for sore throat.   Eyes: Negative for visual disturbance.  Respiratory: Positive for shortness of breath. Negative for cough.   Cardiovascular: Positive for leg swelling. Negative for chest pain.  Gastrointestinal: Positive for diarrhea (chronic unchanged) and nausea. Negative for abdominal pain, constipation and vomiting.  Genitourinary: Negative for difficulty urinating.  Musculoskeletal: Positive for arthralgias and myalgias. Negative for back pain and neck pain.  Skin: Positive for rash.  Neurological: Negative for syncope and headaches.    Physical Exam Updated Vital Signs BP 132/85 (BP Location: Left Arm)   Pulse 92   Temp 98.4 F (36.9 C) (Oral)   Resp 18   Ht 5\' 4"  (1.626 m)   Wt 73 kg   LMP 05/01/2019 (Approximate)   SpO2 100%   BMI 27.64 kg/m   Physical Exam Vitals and nursing note reviewed.  Constitutional:      General: She is not in acute distress.    Appearance: She is well-developed. She is not diaphoretic.  HENT:     Head: Normocephalic and atraumatic.  Eyes:     Conjunctiva/sclera: Conjunctivae normal.  Cardiovascular:     Rate and Rhythm: Normal rate and regular rhythm.     Heart sounds: Normal heart sounds. No murmur. No friction rub. No gallop.   Pulmonary:     Effort: Pulmonary effort is normal. No respiratory distress.     Breath sounds: Normal breath sounds. No wheezing or rales.  Abdominal:     General: There is no distension.     Palpations: Abdomen is soft.     Tenderness: There is no abdominal tenderness. There is no guarding.  Musculoskeletal:        General: No tenderness.     Cervical back: Normal range of motion.     Right lower leg: Edema (trace) present.     Left lower leg: Edema (trace) present.     Comments: Tenderness under left axilla with extension down left chest wall and around towards back, faint contusions present, no sign of abscess or cellulitis   Skin:    General: Skin is warm and dry.     Findings: No erythema or rash.  Neurological:     Mental Status: She is alert and oriented to person, place, and time.     ED Results / Procedures / Treatments   Labs (all labs ordered are listed, but only abnormal results are displayed) Labs Reviewed  COMPREHENSIVE METABOLIC PANEL - Abnormal; Notable for the following components:      Result Value   Calcium 8.7 (*)    AST 13 (*)    All other components within normal  limits  CBC WITH DIFFERENTIAL/PLATELET  BRAIN NATRIURETIC PEPTIDE  URINALYSIS, ROUTINE W REFLEX MICROSCOPIC  PREGNANCY, URINE  TSH  T4, FREE  TROPONIN I (HIGH SENSITIVITY)    EKG EKG Interpretation  Date/Time:  Friday May 22 2019 14:36:13 EDT Ventricular Rate:  87 PR Interval:    QRS Duration: 97 QT Interval:  377 QTC Calculation: 454 R Axis:   64 Text Interpretation: Sinus rhythm No previous ECGs available Confirmed by Gareth Morgan [phone]) on 05/22/2019 3:21:44 PM   Radiology No results found.  Procedures Procedures (including critical care time)  Medications Ordered in ED Medications - No data to display  ED Course  I have reviewed the triage vital signs and the nursing notes.  Pertinent labs & imaging results that were available during my care of the patient were reviewed by me and considered in my medical decision making (see chart for details).    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   No sign of anemia or significant electrolyte abnormality. No sign of renal or liver failure. BNP WNL. Troponin WNL.  UA and pregnancy test pending.  Right DVT US ordered and CT PE study ordered and pending at time of transfer of care to Dr.  Alvino Chapel.    Final Clinical Impression(s) / ED Diagnoses Final diagnoses:  Fatigue, unspecified type  Axillary pain, left  Shortness of breath    Rx / DC Orders ED Discharge Orders    None       Gareth Morgan, MD 05/22/19 1523

## 2019-05-22 NOTE — ED Provider Notes (Signed)
  Physical Exam  BP 132/85 (BP Location: Left Arm)   Pulse 92   Temp 98.4 F (36.9 C) (Oral)   Resp 18   Ht 5\' 4"  (1.626 m)   Wt 73 kg   LMP 05/01/2019 (Approximate)   SpO2 100%   BMI 27.64 kg/m   Physical Exam  ED Course/Procedures     Procedures  MDM  Received patient in signout.  Fatigue, shortness of breath.  Work-up reassuring.  CT scan negative for PE.  No DVT.  TSH still pending.  Follow-up with PCP as an outpatient.  Patient states she would like to find a new PCP.       Davonna Belling, MD 05/22/19 [phone]

## 2019-05-22 NOTE — ED Notes (Signed)
Pt aware of need for urine specimen, unable to void at this time.

## 2019-05-22 NOTE — Discharge Instructions (Addendum)
Follow-up with the primary care doctor for further evaluation of the fatigue and shortness of breath.

## 2019-05-22 NOTE — ED Triage Notes (Addendum)
Pt c/o fatigue x 3 weeks-SOB x 1 week-swelling to UE, LE, "ascites" x 2-3 days and swelling "mass" to left axilla yesterday-pt states she has had a  "melanoma mole removed from right scapula" -NAD-steady gait

## 2019-10-14 ENCOUNTER — Emergency Department (HOSPITAL_COMMUNITY): Payer: 59

## 2019-10-14 ENCOUNTER — Other Ambulatory Visit: Payer: Self-pay

## 2019-10-14 ENCOUNTER — Encounter (HOSPITAL_COMMUNITY): Payer: Self-pay

## 2019-10-14 ENCOUNTER — Emergency Department (HOSPITAL_COMMUNITY)
Admission: EM | Admit: 2019-10-14 | Discharge: 2019-10-15 | Disposition: A | Discharge status: Home / Self Care | Payer: 59 | Attending: Emergency Medicine | Admitting: Emergency Medicine

## 2019-10-14 DIAGNOSIS — R410 Disorientation, unspecified: Secondary | ICD-10-CM | POA: Diagnosis not present

## 2019-10-14 DIAGNOSIS — T43625A Adverse effect of amphetamines, initial encounter: Secondary | ICD-10-CM

## 2019-10-14 DIAGNOSIS — Z87891 Personal history of nicotine dependence: Secondary | ICD-10-CM | POA: Diagnosis not present

## 2019-10-14 LAB — COMPREHENSIVE METABOLIC PANEL
ALT: 22 U/L (ref 0–44)
AST: 18 U/L (ref 15–41)
Albumin: 4.3 g/dL (ref 3.5–5.0)
Alkaline Phosphatase: 59 U/L (ref 38–126)
Anion gap: 9 (ref 5–15)
BUN: 12 mg/dL (ref 6–20)
CO2: 25 mmol/L (ref 22–32)
Calcium: 8.9 mg/dL (ref 8.9–10.3)
Chloride: 103 mmol/L (ref 98–111)
Creatinine, Ser: 0.62 mg/dL (ref 0.44–1.00)
GFR, Estimated: >60 mL/min (ref >60)
Glucose, Bld: 99 mg/dL (ref 70–99)
Potassium: 3.3 mmol/L — ABNORMAL LOW (ref 3.5–5.1)
Sodium: 137 mmol/L (ref 135–145)
Total Bilirubin: 0.5 mg/dL (ref 0.3–1.2)
Total Protein: 7.5 g/dL (ref 6.5–8.1)

## 2019-10-14 LAB — RAPID URINE DRUG SCREEN, HOSP PERFORMED
Amphetamines: POSITIVE — AB
Barbiturates: NOT DETECTED
Benzodiazepines: NOT DETECTED
Cocaine: NOT DETECTED
Opiates: NOT DETECTED
Tetrahydrocannabinol: NOT DETECTED

## 2019-10-14 LAB — URINALYSIS, ROUTINE W REFLEX MICROSCOPIC
Bilirubin Urine: NEGATIVE
Glucose, UA: NEGATIVE mg/dL
Hgb urine dipstick: NEGATIVE
Ketones, ur: 5 mg/dL — AB
Leukocytes,Ua: NEGATIVE
Nitrite: NEGATIVE
Protein, ur: NEGATIVE mg/dL
Specific Gravity, Urine: 1.025 (ref 1.005–1.030)
pH: 6 (ref 5.0–8.0)

## 2019-10-14 LAB — HCG, QUANTITATIVE, PREGNANCY: hCG, Beta Chain, Quant, S: <1 m[IU]/mL (ref <5)

## 2019-10-14 LAB — CBC WITH DIFFERENTIAL/PLATELET
Abs Immature Granulocytes: 0.03 10*3/uL (ref 0.00–0.07)
Basophils Absolute: 0 10*3/uL (ref 0.0–0.1)
Basophils Relative: 0 %
Eosinophils Absolute: 0 10*3/uL (ref 0.0–0.5)
Eosinophils Relative: 0 %
HCT: 37 % (ref 36.0–46.0)
Hemoglobin: 12.6 g/dL (ref 12.0–15.0)
Immature Granulocytes: 0 %
Lymphocytes Relative: 23 %
Lymphs Abs: 2.2 10*3/uL (ref 0.7–4.0)
MCH: 31.5 pg (ref 26.0–34.0)
MCHC: 34.1 g/dL (ref 30.0–36.0)
MCV: 92.5 fL (ref 80.0–100.0)
Monocytes Absolute: 0.9 10*3/uL (ref 0.1–1.0)
Monocytes Relative: 10 %
Neutro Abs: 6.1 10*3/uL (ref 1.7–7.7)
Neutrophils Relative %: 67 %
Platelets: 331 10*3/uL (ref 150–400)
RBC: 4 MIL/uL (ref 3.87–5.11)
RDW: 12.3 % (ref 11.5–15.5)
WBC: 9.3 10*3/uL (ref 4.0–10.5)
nRBC: 0 % (ref 0.0–0.2)

## 2019-10-14 LAB — ETHANOL: Alcohol, Ethyl (B): <10 mg/dL (ref <10)

## 2019-10-14 MED ORDER — AMPHETAMINE-DEXTROAMPHET ER 20 MG PO CP24: 20.0000 mg | ORAL_CAPSULE | Freq: Every day | ORAL | 0 refills | Status: DC

## 2019-10-14 MED ORDER — SODIUM CHLORIDE 0.9 % IV BOLUS
1000.0000 mL | Freq: Once | INTRAVENOUS | Status: AC
  Administered 2019-10-14: 1000 mL via INTRAVENOUS

## 2019-10-14 NOTE — Discharge Instructions (Addendum)
Stop taking Venlafaxine (you might want to taper over a week or so)  Take the taper dose of adderal.

## 2019-10-14 NOTE — ED Triage Notes (Signed)
Patient arrived stating she has been feeling confused today. Family states this is a reoccurring issue but has not been evaluated for it in the past because it normally resolves. Reporting a headache, leg swelling, and "masses in her stomach"

## 2019-10-14 NOTE — ED Provider Notes (Signed)
[facility] DEPT Provider Note   CSN: [id number] Arrival date & time: 10/14/19  1909     History No chief complaint on file.   Mackenzie Curry is a 34 y.o. female.  Patient presents with confusion not able to think properly.  Patient takes Adderall and Effexor.  She is recently had increase in her Adderall.  The history is provided by the patient and a friend. No language interpreter was used.  Altered Mental Status Presenting symptoms: behavior changes and confusion   Severity:  Moderate Most recent episode:  Today Episode history:  Multiple Timing:  Intermittent Progression:  Waxing and waning Chronicity:  New Context: not alcohol use   Associated symptoms: no abdominal pain, no hallucinations, no headaches, no rash and no seizures        Past Medical History:  Diagnosis Date  . ADD (attention deficit disorder)   . Allergy   . Anxiety   . Depression   . IBS (irritable bowel syndrome)   . No pertinent past medical history   . PONV (postoperative nausea and vomiting)     Patient Active Problem List   Diagnosis Date Noted  . History of tobacco abuse 12/22/2014  . Depression   . IBS (irritable bowel syndrome)   . Anxiety   . Allergy     Past Surgical History:  Procedure Laterality Date  . ANTERIOR CRUCIATE LIGAMENT REPAIR       OB History    Gravida  1   Para  1   Term  0   Preterm  1   AB  0   Living  1     SAB  0   TAB  0   Ectopic  0   Multiple  0   Live Births  1           Family History  Problem Relation Age of Onset  . Hypertension Mother   . Hypertension Father   . Anesthesia problems Neg Hx   . Hypotension Neg Hx   . Malignant hyperthermia Neg Hx   . Pseudochol deficiency Neg Hx     Social History   Tobacco Use  . Smoking status: Former Smoker    Quit date: 08/06/2015    Years since quitting: 4.1  . Smokeless tobacco: Never Used  Vaping Use  . Vaping Use: Every day  Substance  Use Topics  . Alcohol use: Yes    Comment: occ  . Drug use: No    Home Medications Prior to Admission medications   Medication Sig Start Date End Date Taking? Authorizing Provider  venlafaxine XR (EFFEXOR-XR) 75 MG 24 hr capsule Take 75 mg by mouth every evening.  08/09/19  Yes [provider]  Vitamin D, Ergocalciferol, (DRISDOL) 1.25 MG (50000 UNIT) CAPS capsule Take 50,000 Units by mouth once a week. 07/03/19  Yes [provider]  amphetamine-dextroamphetamine (ADDERALL XR) 20 MG 24 hr capsule Take 1 capsule (20 mg total) by mouth daily. 10/14/19   Milton Ferguson, MD  buPROPion (WELLBUTRIN XL) 300 MG 24 hr tablet TAKE 1 TABLET BY MOUTH EVERY DAY Patient not taking: Reported on 10/14/2019 01/06/17   Unk Pinto, MD  Ciclopirox 1 % shampoo Apply 1 Applicatorful topically at bedtime. Patient not taking: Reported on 10/14/2019 02/07/16   Vicie Mutters, PA-C  clobetasol (TEMOVATE) 0.05 % external solution Apply 1 application topically 2 (two) times daily. Patient not taking: Reported on 10/14/2019 02/07/16   Vicie Mutters, PA-C  elvitegravir-cobicistat-emtricitabine-tenofovir (  GENVOYA) 150-150-200-10 MG TABS tablet Take 1 tablet by mouth daily with breakfast. Patient not taking: Reported on 10/14/2019 03/15/18   Darlin Drop P, PA-C  lisdexamfetamine (VYVANSE) 50 MG capsule Take 1 capsule (50 mg total) daily by mouth. Patient not taking: Reported on 10/14/2019 11/05/16   Vicie Mutters, PA-C  phenazopyridine (PYRIDIUM) 200 MG tablet Take 1 tablet (200 mg total) by mouth 3 (three) times daily as needed for pain. For 2 days.  Take after meals. Patient not taking: Reported on 10/14/2019 03/27/16   Vicie Mutters, PA-C  sertraline (ZOLOFT) 50 MG tablet Take 1 tablet (50 mg total) by mouth daily. 12/20/16 12/20/17  Vicie Mutters, PA-C    Allergies    Sulfa antibiotics  Review of Systems   Review of Systems  Constitutional: Negative for appetite change and fatigue.  HENT:  Negative for congestion, ear discharge and sinus pressure.   Eyes: Negative for discharge.  Respiratory: Negative for cough.   Cardiovascular: Negative for chest pain.  Gastrointestinal: Negative for abdominal pain and diarrhea.  Genitourinary: Negative for frequency and hematuria.  Musculoskeletal: Negative for back pain.  Skin: Negative for rash.  Neurological: Negative for seizures and headaches.  Psychiatric/Behavioral: Positive for confusion. Negative for hallucinations.    Physical Exam Updated Vital Signs BP 129/87   Pulse 93   Temp 97.6 F (36.4 C)   Resp 15   SpO2 98%   Physical Exam Vitals and nursing note reviewed.  Constitutional:      Appearance: She is well-developed.  HENT:     Head: Normocephalic.     Nose: Nose normal.  Eyes:     General: No scleral icterus.    Conjunctiva/sclera: Conjunctivae normal.  Neck:     Thyroid: No thyromegaly.  Cardiovascular:     Rate and Rhythm: Normal rate and regular rhythm.     Heart sounds: No murmur heard.  No friction rub. No gallop.   Pulmonary:     Breath sounds: No stridor. No wheezing or rales.  Chest:     Chest wall: No tenderness.  Abdominal:     General: There is no distension.     Tenderness: There is no abdominal tenderness. There is no rebound.  Genitourinary:    Comments: Speculum exam done which was normal.  Patient has a small cyst like mass in between her anus and her vagina Musculoskeletal:        General: Normal range of motion.     Cervical back: Neck supple.  Lymphadenopathy:     Cervical: No cervical adenopathy.  Skin:    Findings: No erythema or rash.  Neurological:     Mental Status: She is alert and oriented to person, place, and time.     Motor: No abnormal muscle tone.     Coordination: Coordination normal.  Psychiatric:     Comments: Patient having some mania     ED Results / Procedures / Treatments   Labs (all labs ordered are listed, but only abnormal results are  displayed) Labs Reviewed  COMPREHENSIVE METABOLIC PANEL - Abnormal; Notable for the following components:      Result Value   Potassium 3.3 (*)    All other components within normal limits  URINALYSIS, ROUTINE W REFLEX MICROSCOPIC - Abnormal; Notable for the following components:   Ketones, ur 5 (*)    All other components within normal limits  RAPID URINE DRUG SCREEN, HOSP PERFORMED - Abnormal; Notable for the following components:   Amphetamines POSITIVE (*)  All other components within normal limits  CBC WITH DIFFERENTIAL/PLATELET  ETHANOL  HCG, QUANTITATIVE, PREGNANCY    EKG None  Radiology CT Head Wo Contrast  Result Date: 10/14/2019 CLINICAL DATA:  34 year old female with confusion on set today. Headache. EXAM: CT HEAD WITHOUT CONTRAST TECHNIQUE: Contiguous axial images were obtained from the base of the skull through the vertex without intravenous contrast. COMPARISON:  None. FINDINGS: Brain: Normal cerebral volume. No midline shift, ventriculomegaly, mass effect, evidence of mass lesion, intracranial hemorrhage or evidence of cortically based acute infarction. Gray-white matter differentiation is within normal limits throughout the brain. Vascular: No suspicious intracranial vascular hyperdensity. Skull: Negative. Sinuses/Orbits: Visualized paranasal sinuses and mastoids are clear. Other: Visualized orbits and scalp soft tissues are within normal limits. IMPRESSION: Normal noncontrast CT appearance of the brain. Electronically Signed   By: Genevie Ann M.D.   On: 10/14/2019 21:34    Procedures Procedures (including critical care time)  Medications Ordered in ED Medications  sodium chloride 0.9 % bolus 1,000 mL (has no administration in time range)    ED Course  I have reviewed the triage vital signs and the nursing notes.  Pertinent labs & imaging results that were available during my care of the patient were reviewed by me and considered in my medical decision making (see  chart for details).   Labs and CT scan unremarkable.  I spoke with neurology about the patient and it was felt that the patient needed to decrease her Adderall back to what she was taking last week. MDM Rules/Calculators/A&P                          Altered mental status from amphetamine overdose.  Patient will decrease her Adderall so she is taking only 20 XR at night and he will stop the additional 5 mg twice a day.      This patient presents to the ED for concern of mania this involves an extensive number of treatment options, and is a complaint that carries with it a high risk of complications and morbidity.  The differential diagnosis includes medication reaction   Lab Tests:   I Ordered, reviewed, and interpreted labs, which included CBC chemistries which were essentially normal  Medicines ordered:   I ordered medication normal saline for dehydration  Imaging Studies ordered:   I ordered imaging studies which included CT head  I independently visualized and interpreted imaging which showed negative  Additional history obtained:   Additional history obtained from friend  Previous records obtained and reviewed.  Consultations Obtained:   I consulted neurology and discussed lab and imaging findings  Reevaluation:  After the interventions stated above, I reevaluated the patient and found mild improvement  Critical Interventions:  .   Final Clinical Impression(s) / ED Diagnoses Final diagnoses:  Amphetamine adverse reaction, initial encounter    Rx / DC Orders ED Discharge Orders         Ordered    amphetamine-dextroamphetamine (ADDERALL XR) 20 MG 24 hr capsule  Daily        10/14/19 2344           Milton Ferguson, MD 10/16/19 1157

## 2019-10-15 MED ORDER — AMPHETAMINE-DEXTROAMPHETAMINE 5 MG PO TABS: ORAL_TABLET | ORAL | 0 refills | Status: DC

## 2019-10-15 NOTE — ED Notes (Signed)
Pt teary and states she just does not feel right. This Probation officer encouraged pt to speak with provider again but pt refuses at this time. Pt states if she continues to feel this way she will contact her primary care physician. This Probation officer again asked pt if she was sure that she was ok and did not want to see the provider and she again refused at this time.

## 2019-10-15 NOTE — ED Provider Notes (Signed)
Asked to speak with the patient by nursing staff.  Upon my evaluation, patient very upset.  Has been experiencing sensation of her hair growing into her skin and throughout her body for the last few months.  She has been picking at the hair at the nape of her neck and pulling it out.  She has multiple scabs over her body from obvious scratching and picking behavior.  Suspect that this is consistent with a fixed delusion/psychosis secondary to the Adderall use.  Had a lengthy conversation with the patient and her friend at bedside.  At this point it seems prudent to stop her medications entirely and see if this changes her behavior.  She is not homicidal or suicidal.  She does not seem completely incapacitated or unable to care for herself, is going home with her friend tonight.  I rewrote the prescription for a taper of the Adderall.   Orpah Greek, MD 10/15/19 [phone]

## 2019-10-15 NOTE — ED Notes (Signed)
Provider at bedside

## 2019-10-15 NOTE — ED Notes (Signed)
Provider made aware that pt was to speak with him

## 2020-03-01 ENCOUNTER — Encounter: Payer: Self-pay | Admitting: Adult Health Nurse Practitioner

## 2020-03-01 ENCOUNTER — Ambulatory Visit: Payer: 59 | Admitting: Adult Health Nurse Practitioner

## 2020-03-01 ENCOUNTER — Other Ambulatory Visit: Payer: Self-pay

## 2020-03-01 ENCOUNTER — Encounter (INDEPENDENT_AMBULATORY_CARE_PROVIDER_SITE_OTHER): Payer: Self-pay

## 2020-03-01 VITALS — BP 122/80 | HR 76 | Temp 98.1°F | Wt 178.0 lb

## 2020-03-01 DIAGNOSIS — M2550 Pain in unspecified joint: Secondary | ICD-10-CM

## 2020-03-01 DIAGNOSIS — E559 Vitamin D deficiency, unspecified: Secondary | ICD-10-CM | POA: Diagnosis not present

## 2020-03-01 DIAGNOSIS — R7309 Other abnormal glucose: Secondary | ICD-10-CM

## 2020-03-01 DIAGNOSIS — I10 Essential (primary) hypertension: Secondary | ICD-10-CM

## 2020-03-01 DIAGNOSIS — K8020 Calculus of gallbladder without cholecystitis without obstruction: Secondary | ICD-10-CM

## 2020-03-01 DIAGNOSIS — R5383 Other fatigue: Secondary | ICD-10-CM

## 2020-03-01 DIAGNOSIS — R5381 Other malaise: Secondary | ICD-10-CM | POA: Diagnosis not present

## 2020-03-01 DIAGNOSIS — N644 Mastodynia: Secondary | ICD-10-CM

## 2020-03-01 DIAGNOSIS — R0602 Shortness of breath: Secondary | ICD-10-CM

## 2020-03-01 DIAGNOSIS — F988 Other specified behavioral and emotional disorders with onset usually occurring in childhood and adolescence: Secondary | ICD-10-CM

## 2020-03-01 MED ORDER — BUPROPION HCL ER (XL) 150 MG PO TB24: 150.0000 mg | ORAL_TABLET | ORAL | 3 refills | Status: AC

## 2020-03-01 NOTE — Progress Notes (Signed)
ESTABLISH CARE  Assessment and Plan:  Mackenzie Curry was seen today for re-establish.  Diagnoses and all orders for this visit:  Abnormal glucose Discussed dietary and exercise modifications  Essential hypertension Monitor blood pressure at home; call if consistently over 130/80 Continue DASH diet.   Reminder to go to the ER if any CP, SOB, nausea, dizziness, severe HA, changes vision/speech, left arm numbness and tingling and jaw pain. -     CBC with Differential/Platelet -     COMPLETE METABOLIC PANEL WITH GFR -     TSH  Vitamin D deficiency Continue supplementation to maintain goal of 70-100 Not taking Vitamin D  Will check related to fatigue -     VITAMIN D 25 Hydroxy (Vit-D Deficiency, Fractures)  Malaise and fatigue -     TSH -     Urinalysis w microscopic + reflex cultur -     Vitamin B12  Arthralgia, unspecified joint -     Sedimentation rate -     C-reactive protein -     Rheumatoid factor -     CK  Exertional shortness of breath -     DG Chest 2 View; Future  Attention deficit disorder (ADD) without hyperactivity -     buPROPion (WELLBUTRIN XL) 150 MG 24 hr tablet; Take 1 tablet (150 mg total) by mouth every morning.  Calculus of gallbladder without cholecystitis without obstruction HIDA scan, GI vs general surgery  Breast pain, left Mammogram, left breast  Other orders -     REFLEXIVE URINE CULTURE  Patient has not had medical follow up in three years with multiple complaints.  Follow up determined by results.  Will plan to follow up for full physical appointment.  Contact office with any new or worsening symptoms.   Discussed med's effects and SE's. Screening labs and tests as requested with regular follow-up as recommended. Over 40 minutes of face to face interview,  exam, counseling, chart review, and complex, high level critical decision making was performed this visit.    HPI  35 y.o. female  presents to establish care.  She recently  *moved/transfered care. does not have a problem list on file.[name]  She was previsouly a patient here in our office but has been over three years since seen last.  She reports that she has ADD and previous taking Adderall 5mg  for years.  Trialed Vyvance on and off.  Taking Wellbutrin 150 and Adderall 25mg  XR. Currently taking the Wellbutrin 150XL. Reports she feels irritable.  She is also having pain, generalized pain, joint pain, and edema.    Will request records from [facility].  Vitamin D 50,000  Release of records form provided.  She had a CT scan 05/22/19 to rule our PE as she was having left axillary pain & swelling.  Incidental 54mm gallstone seen within lumen.  She reports she did not know this was present and was  Not previously discussed. She is having RUQ discomfort.  Her blood pressure has been controlled at home, today their BP is   She does not workout. She denies chest pain, shortness of breath, dizziness.   She is not on cholesterol medication and denies myalgias. Her cholesterol is at goal. The cholesterol last visit was:  No results found for: CHOL, HDL, LDLCALC, LDLDIRECT, TRIG, CHOLHDL  She has been working on diet and exercise for prediabetes, she is not on bASA, she is not on ACE/ARB and denies paresthesia of the feet, polydipsia, polyuria, visual disturbances, vomiting and  weight loss. Last A1C in the office was: No results found for: HGBA1C  Last GFR: No results found for: GFRNONAA No results found for: GFRAA  Patient is on Vitamin D supplement.   No results found for: VD25OH    Current Medications:  No current outpatient medications on file prior to visit.   No current facility-administered medications on file prior to visit.    Allergies:  Not on File  Medical History:  She does not have a problem list on file.   Names of Other Physician/Practitioners you currently use: 1. [facility] here for primary  care 2. Eye Exam: Due for 2022 3. Dentist Q34months, due for 2022 Patient Care Team: Unk Pinto, MD as PCP - General (Internal Medicine)  Health Maintenance:    There is no immunization history on file for this patient.   Preventative care: Last colonoscopy: N/A Last mammogram: N/A Last pap smear/pelvic exam:  2012, OVERDUE  DEXA:N/A   Vaccinations: unknown, in healthcare likely up to date, will request records. TD or Tdap:   Influenza:   Pneumococcal:  Prevnar13:  Shingles/Zostavax:  HPV:   :  Surgical History:  She has no past surgical history on file.  Family History:  Her family history is not on file. To be entered.  Social History:  She   Review of Systems: Review of Systems  Constitutional: Positive for malaise/fatigue. Negative for chills, diaphoresis, fever and weight loss.  HENT: Negative for congestion, ear discharge, ear pain, hearing loss, nosebleeds, sinus pain, sore throat and tinnitus.   Eyes: Negative for blurred vision, double vision, photophobia, pain, discharge and redness.  Respiratory: Positive for cough. Negative for hemoptysis, sputum production and stridor.   Cardiovascular: Positive for chest pain. Negative for palpitations, orthopnea, claudication, leg swelling and PND.  Gastrointestinal: Positive for abdominal pain. Negative for blood in stool, constipation, diarrhea, heartburn, melena, nausea and vomiting.  Genitourinary: Negative for dysuria, flank pain, frequency, hematuria and urgency.  Musculoskeletal: Positive for joint pain and myalgias. Negative for back pain, falls and neck pain.  Skin: Negative for itching and rash.  Neurological: Negative for dizziness, tingling, tremors, sensory change and headaches.  Endo/Heme/Allergies: Negative for environmental allergies and polydipsia. Does not bruise/bleed easily.  Psychiatric/Behavioral: Negative for depression, memory loss, substance abuse and suicidal ideas. The patient is not  nervous/anxious and does not have insomnia.      Physical Exam: There is no height or weight on file to calculate BMI. There were no vitals taken for this visit. General Appearance: Well nourished, in no apparent distress.  Eyes: PERRLA, EOMs, conjunctiva no swelling or erythema, normal fundi and vessels.  Sinuses: No Frontal/maxillary tenderness  ENT/Mouth: Ext aud canals clear, normal light reflex with TMs without erythema, bulging. Good dentition. No erythema, swelling, or exudate on post pharynx. Tonsils not swollen or erythematous. Hearing normal.  Neck: Supple, thyroid normal. No bruits  Respiratory: Respiratory effort normal, BS equal bilaterally without rales, rhonchi, wheezing or stridor.  Cardio: RRR without murmurs, rubs or gallops. Brisk peripheral pulses without edema.  Chest: symmetric, with normal excursions and percussion.  Breasts: Symmetric, without lumps, nipple discharge, retractions. Left breast tenderness noted at 11 o'clock behind nipple.  Tenderness noted also to 12 o'clock extending up below clavicle, lateral extending into axilla. Abdomen: Soft, generalized tenderness to RUQ, no guarding, rebound, hernias, masses, or organomegaly.  Lymphatics: Non tender without lymphadenopathy.  Musculoskeletal: Full ROM all peripheral extremities,5/5 strength, and normal gait.  Skin: Warm, dry without  rashes, lesions, ecchymosis. Neuro: Cranial nerves intact, reflexes equal bilaterally. Normal muscle tone, no cerebellar symptoms. Sensation intact.  Psych: Awake and oriented X 3, normal affect, Insight and Judgment appropriate.   EKG: check next [place], NP 3:18 PM [facility]

## 2020-03-02 LAB — COMPLETE METABOLIC PANEL WITH GFR
AG Ratio: 1.7 (calc) (ref 1.0–2.5)
ALT: 13 U/L (ref 6–29)
AST: 11 U/L (ref 10–30)
Albumin: 4.5 g/dL (ref 3.6–5.1)
Alkaline phosphatase (APISO): 60 U/L (ref 31–125)
BUN: 12 mg/dL (ref 7–25)
CO2: 26 mmol/L (ref 20–32)
Calcium: 9.4 mg/dL (ref 8.6–10.2)
Chloride: 103 mmol/L (ref 98–110)
Creat: 0.64 mg/dL (ref 0.50–1.10)
GFR, Est African American: 135 mL/min/{1.73_m2} (ref >=60)
GFR, Est Non African American: 116 mL/min/{1.73_m2} (ref >=60)
Globulin: 2.7 g/dL (calc) (ref 1.9–3.7)
Glucose, Bld: 75 mg/dL (ref 65–99)
Potassium: 3.9 mmol/L (ref 3.5–5.3)
Sodium: 138 mmol/L (ref 135–146)
Total Bilirubin: 0.4 mg/dL (ref 0.2–1.2)
Total Protein: 7.2 g/dL (ref 6.1–8.1)

## 2020-03-02 LAB — CBC WITH DIFFERENTIAL/PLATELET
Absolute Monocytes: 564 cells/uL (ref 200–950)
Basophils Absolute: 47 cells/uL (ref 0–200)
Basophils Relative: 0.5 %
Eosinophils Absolute: 28 cells/uL (ref 15–500)
Eosinophils Relative: 0.3 %
HCT: 38.3 % (ref 35.0–45.0)
Hemoglobin: 13.2 g/dL (ref 11.7–15.5)
Lymphs Abs: 2453 cells/uL (ref 850–3900)
MCH: 32 pg (ref 27.0–33.0)
MCHC: 34.5 g/dL (ref 32.0–36.0)
MCV: 93 fL (ref 80.0–100.0)
MPV: 11.1 fL (ref 7.5–12.5)
Monocytes Relative: 6 %
Neutro Abs: 6307 cells/uL (ref 1500–7800)
Neutrophils Relative %: 67.1 %
Platelets: 319 10*3/uL (ref 140–400)
RBC: 4.12 10*6/uL (ref 3.80–5.10)
RDW: 12.3 % (ref 11.0–15.0)
Total Lymphocyte: 26.1 %
WBC: 9.4 10*3/uL (ref 3.8–10.8)

## 2020-03-02 LAB — VITAMIN B12: Vitamin B-12: 370 pg/mL (ref 200–1100)

## 2020-03-02 LAB — VITAMIN D 25 HYDROXY (VIT D DEFICIENCY, FRACTURES): Vit D, 25-Hydroxy: 60 ng/mL (ref 30–100)

## 2020-03-02 LAB — CK: Total CK: 64 U/L (ref 29–143)

## 2020-03-02 LAB — SEDIMENTATION RATE: Sed Rate: 2 mm/h (ref 0–20)

## 2020-03-02 LAB — C-REACTIVE PROTEIN: CRP: 2.1 mg/L (ref <8.0)

## 2020-03-02 LAB — TSH: TSH: 1.28 mIU/L

## 2020-03-02 LAB — RHEUMATOID FACTOR: Rheumatoid fact SerPl-aCnc: <14 IU/mL (ref <14)

## 2020-03-03 LAB — URINALYSIS W MICROSCOPIC + REFLEX CULTURE
Bacteria, UA: NONE SEEN /HPF
Bilirubin Urine: NEGATIVE
Glucose, UA: NEGATIVE
Hyaline Cast: NONE SEEN /LPF
Ketones, ur: NEGATIVE
Leukocyte Esterase: NEGATIVE
Nitrites, Initial: NEGATIVE
Protein, ur: NEGATIVE
Specific Gravity, Urine: 1.013 (ref 1.001–1.03)
WBC, UA: NONE SEEN /HPF (ref 0–5)
pH: 6 (ref 5.0–8.0)

## 2020-03-03 LAB — NO CULTURE INDICATED

## 2020-03-09 ENCOUNTER — Other Ambulatory Visit: Payer: Self-pay | Admitting: Adult Health Nurse Practitioner

## 2020-03-09 DIAGNOSIS — N644 Mastodynia: Secondary | ICD-10-CM

## 2020-03-15 ENCOUNTER — Encounter: Payer: 59 | Admitting: Adult Health Nurse Practitioner

## 2020-03-25 ENCOUNTER — Other Ambulatory Visit: Payer: Self-pay

## 2020-03-25 ENCOUNTER — Encounter
Admission: RE | Admit: 2020-03-25 | Discharge: 2020-03-25 | Disposition: A | Discharge status: Home / Self Care | Payer: 59 | Source: Ambulatory Visit | Attending: Adult Health Nurse Practitioner | Admitting: Adult Health Nurse Practitioner

## 2020-03-25 DIAGNOSIS — K8020 Calculus of gallbladder without cholecystitis without obstruction: Secondary | ICD-10-CM | POA: Diagnosis not present

## 2020-03-25 MED ORDER — TECHNETIUM TC 99M MEBROFENIN IV KIT
5.0700 | PACK | Freq: Once | INTRAVENOUS | Status: AC | PRN
  Administered 2020-03-25: 5.07 via INTRAVENOUS

## 2020-04-06 ENCOUNTER — Encounter: Payer: 59 | Admitting: Adult Health Nurse Practitioner

## 2020-04-21 ENCOUNTER — Other Ambulatory Visit: Payer: 59

## 2020-04-22 ENCOUNTER — Other Ambulatory Visit: Payer: Self-pay | Admitting: Adult Health Nurse Practitioner

## 2020-04-22 DIAGNOSIS — F988 Other specified behavioral and emotional disorders with onset usually occurring in childhood and adolescence: Secondary | ICD-10-CM

## 2021-04-06 ENCOUNTER — Encounter: Payer: 59 | Admitting: Adult Health Nurse Practitioner

## 2021-06-19 ENCOUNTER — Other Ambulatory Visit: Payer: Self-pay | Admitting: Infectious Diseases

## 2021-06-19 DIAGNOSIS — R1031 Right lower quadrant pain: Secondary | ICD-10-CM

## 2021-06-29 ENCOUNTER — Ambulatory Visit
Admission: RE | Admit: 2021-06-29 | Discharge: 2021-06-29 | Disposition: A | Discharge status: Home / Self Care | Payer: 59 | Source: Ambulatory Visit | Attending: Infectious Diseases | Admitting: Infectious Diseases

## 2021-06-29 DIAGNOSIS — R1031 Right lower quadrant pain: Secondary | ICD-10-CM

## 2021-06-29 MED ORDER — IOPAMIDOL (ISOVUE-300) INJECTION 61%
100.0000 mL | Freq: Once | INTRAVENOUS | Status: AC | PRN
  Administered 2021-06-29: 100 mL via INTRAVENOUS

## 2021-10-08 ENCOUNTER — Emergency Department: Payer: 59

## 2021-10-08 ENCOUNTER — Emergency Department
Admission: EM | Admit: 2021-10-08 | Discharge: 2021-10-08 | Disposition: A | Discharge status: Home / Self Care | Payer: 59 | Attending: Emergency Medicine | Admitting: Emergency Medicine

## 2021-10-08 ENCOUNTER — Encounter: Payer: Self-pay | Admitting: Emergency Medicine

## 2021-10-08 ENCOUNTER — Other Ambulatory Visit: Payer: Self-pay

## 2021-10-08 DIAGNOSIS — H538 Other visual disturbances: Secondary | ICD-10-CM | POA: Diagnosis not present

## 2021-10-08 DIAGNOSIS — H539 Unspecified visual disturbance: Secondary | ICD-10-CM

## 2021-10-08 DIAGNOSIS — Z20822 Contact with and (suspected) exposure to covid-19: Secondary | ICD-10-CM | POA: Diagnosis not present

## 2021-10-08 DIAGNOSIS — R531 Weakness: Secondary | ICD-10-CM | POA: Diagnosis present

## 2021-10-08 DIAGNOSIS — R29898 Other symptoms and signs involving the musculoskeletal system: Secondary | ICD-10-CM

## 2021-10-08 LAB — CBC WITH DIFFERENTIAL/PLATELET
Abs Immature Granulocytes: 0.03 10*3/uL (ref 0.00–0.07)
Basophils Absolute: 0 10*3/uL (ref 0.0–0.1)
Basophils Relative: 1 %
Eosinophils Absolute: 0 10*3/uL (ref 0.0–0.5)
Eosinophils Relative: 0 %
HCT: 38 % (ref 36.0–46.0)
Hemoglobin: 13 g/dL (ref 12.0–15.0)
Immature Granulocytes: 0 %
Lymphocytes Relative: 30 %
Lymphs Abs: 2.4 10*3/uL (ref 0.7–4.0)
MCH: 30.7 pg (ref 26.0–34.0)
MCHC: 34.2 g/dL (ref 30.0–36.0)
MCV: 89.6 fL (ref 80.0–100.0)
Monocytes Absolute: 0.6 10*3/uL (ref 0.1–1.0)
Monocytes Relative: 8 %
Neutro Abs: 4.7 10*3/uL (ref 1.7–7.7)
Neutrophils Relative %: 61 %
Platelets: 374 10*3/uL (ref 150–400)
RBC: 4.24 MIL/uL (ref 3.87–5.11)
RDW: 11.8 % (ref 11.5–15.5)
WBC: 7.8 10*3/uL (ref 4.0–10.5)
nRBC: 0 % (ref 0.0–0.2)

## 2021-10-08 LAB — BASIC METABOLIC PANEL
Anion gap: 7 (ref 5–15)
BUN: 14 mg/dL (ref 6–20)
CO2: 24 mmol/L (ref 22–32)
Calcium: 9.3 mg/dL (ref 8.9–10.3)
Chloride: 103 mmol/L (ref 98–111)
Creatinine, Ser: 0.71 mg/dL (ref 0.44–1.00)
GFR, Estimated: >60 mL/min (ref >60)
Glucose, Bld: 99 mg/dL (ref 70–99)
Potassium: 3.8 mmol/L (ref 3.5–5.1)
Sodium: 134 mmol/L — ABNORMAL LOW (ref 135–145)

## 2021-10-08 LAB — URINALYSIS, ROUTINE W REFLEX MICROSCOPIC
Bacteria, UA: NONE SEEN
Bilirubin Urine: NEGATIVE
Glucose, UA: NEGATIVE mg/dL
Hgb urine dipstick: NEGATIVE
Ketones, ur: NEGATIVE mg/dL
Nitrite: NEGATIVE
Protein, ur: NEGATIVE mg/dL
Specific Gravity, Urine: 1.021 (ref 1.005–1.030)
pH: 6 (ref 5.0–8.0)

## 2021-10-08 LAB — TSH: TSH: 2.41 u[IU]/mL (ref 0.350–4.500)

## 2021-10-08 LAB — RESP PANEL BY RT-PCR (FLU A&B, COVID) ARPGX2
Influenza A by PCR: NEGATIVE
Influenza B by PCR: NEGATIVE
SARS Coronavirus 2 by RT PCR: NEGATIVE

## 2021-10-08 LAB — POC URINE PREG, ED: Preg Test, Ur: NEGATIVE

## 2021-10-08 LAB — TROPONIN I (HIGH SENSITIVITY)
Troponin I (High Sensitivity): <2 ng/L (ref <18)
Troponin I (High Sensitivity): <2 ng/L (ref <18)

## 2021-10-08 MED ORDER — GADOBUTROL 1 MMOL/ML IV SOLN
8.0000 mL | Freq: Once | INTRAVENOUS | Status: AC | PRN
  Administered 2021-10-08: 8 mL via INTRAVENOUS

## 2021-10-08 NOTE — ED Notes (Signed)
To MRI

## 2021-10-08 NOTE — ED Triage Notes (Signed)
Pt to ED from home with multiple complaints.  States for the last month has felt fatigued and had productive cough that seemed to reside.  States over last week has felt spots of numbness to left face, decreased hearing in left ear, feeling like upper left chest swollen and then today more decreased sensation to left side, left foot jerking when moving it and left eye blurry started approx 0045.  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  Pt A&Ox4, chest rise even and unlabored, in NAD at this time.  PA in triage for MSE.

## 2021-10-08 NOTE — Discharge Instructions (Signed)
Please follow up with the neurologist. Please seek medical attention for any high fevers, chest pain, shortness of breath, change in behavior, persistent vomiting, bloody stool or any other new or concerning symptoms.

## 2021-10-08 NOTE — ED Provider Triage Note (Signed)
Emergency Medicine Provider Triage Evaluation Note  SHENIQUA CAROLAN , a 36 y.o. female  was evaluated in triage.  Pt complains of fatigue x1 month, also had cough that was productive and that resolved, but now dry cough has persisted. Also couldn't hear out of left ear, but that resolved. 1 week ago has noticed lump to her left anterior chest. 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 Feels like someone is squeezing left arm. Np CP/SOB  Review of Systems  Positive: Blurry vision, dry cough,   Negative: cp  Physical Exam  There were no vitals taken for this visit. Gen:   Awake, no distress   Resp:  Normal effort  MSK:   Moves extremities without difficulty  Other:    Medical Decision Making  Medically screening exam initiated at 1:46 PM.  Appropriate orders placed.  AMILIANA FOUTZ was informed that the remainder of the evaluation will be completed by another provider, this initial triage assessment does not replace that evaluation, and the importance of remaining in the ED until their evaluation is complete.     Marquette Old, PA-C 10/08/21 1354

## 2021-10-08 NOTE — ED Provider Notes (Signed)
[facility] Provider Note    Event Date/Time   First MD Initiated Contact with Patient 10/08/21 1954     (approximate)   History   Weakness   HPI  Mackenzie Curry is a 36 y.o. female who presents to the emergency department today with multiple medical complaints.  She states that for roughly the past month she has noticed increasing generalized fatigue.  Exercise tolerance has greatly decreased.  She has also noticed some difficulty with dorsi flexion of her left foot.  She feels as if her muscles are getting stuck.  Additionally she feels her left eye has become more blurry than normal.  The patient also has some discomfort around her left eye and nose.  Additionally some sense of fullness to her left upper chest.     Physical Exam   Triage Vital Signs: ED Triage Vitals  Enc Vitals Group     BP 10/08/21 1349 (!) 143/108     Pulse Rate 10/08/21 1349 (!) 108     Resp 10/08/21 1349 18     Temp 10/08/21 1349 98 F (36.7 C)     Temp Source 10/08/21 1349 Oral     SpO2 10/08/21 1349 100 %     Weight 10/08/21 1350 170 lb (77.1 kg)     Height 10/08/21 1350 '5\' 3"'$  (1.6 m)     Head Circumference --      Peak Flow --      Pain Score 10/08/21 1350 0     Pain Loc --      Pain Edu? --      Excl. in [place]? --     Most recent vital signs: Vitals:   10/08/21 1349 10/08/21 1759  BP: (!) 143/108 (!) 134/91  Pulse: (!) 108 88  Resp: 18 16  Temp: 98 F (36.7 C) 98.2 F (36.8 C)  SpO2: 100% 100%   General: Awake, alert, oriented. CV:  Good peripheral perfusion. Regular rate and rhythm. Resp:  Normal effort. Lungs clear. Abd:  No distention. Non tender. Other:  EOMI. PERRL. Face symmetric. Tongue midline. No upper extremity pronator drift. Difficulty with dorsiflexion of bilateral feet. Sensation grossly intact.    ED Results / Procedures / Treatments   Labs (all labs ordered are listed, but only abnormal results are displayed) Labs Reviewed  BASIC  METABOLIC PANEL - Abnormal; Notable for the following components:      Result Value   Sodium 134 (*)    All other components within normal limits  URINALYSIS, ROUTINE W REFLEX MICROSCOPIC - Abnormal; Notable for the following components:   Color, Urine YELLOW (*)    APPearance CLOUDY (*)    Leukocytes,Ua TRACE (*)    All other components within normal limits  RESP PANEL BY RT-PCR (FLU A&B, COVID) ARPGX2  CBC WITH DIFFERENTIAL/PLATELET  TSH  LYME DISEASE SEROLOGY W/REFLEX  POC URINE PREG, ED  TROPONIN I (HIGH SENSITIVITY)  TROPONIN I (HIGH SENSITIVITY)     EKG  I, Nance Pear, attending physician, personally viewed and interpreted this EKG  EKG Time: 1355 Rate: 98 Rhythm: normal sinus rhythm Axis: normal Intervals: qtc 421 QRS: narrow ST changes: no st elevation Impression: normal ekg   RADIOLOGY I independently interpreted and visualized the CXR. My interpretation: No acute abnormality Radiology interpretation:  IMPRESSION:  No active cardiopulmonary disease.   MR brain IMPRESSION:  Normal brain MRI. No acute intracranial abnormality identified.     PROCEDURES:  Critical Care performed: No  Procedures   MEDICATIONS ORDERED IN ED: Medications - No data to display   IMPRESSION / MDM / [place] / ED COURSE  I reviewed the triage vital signs and the nursing notes.                              Differential diagnosis includes, but is not limited to, anemia, electrolyte abnormality, infection, thyroid disorder, MS, lyme disease.  Patient's presentation is most consistent with acute presentation with potential threat to life or bodily function.  Patient presented to the emergency department today because of concerns for multiple medical complaints.  Initial work-up from triage without any clear etiology.  We will plan on adding TSH and Lyme serology.  Additionally will get MRI of the brain to evaluate for MS given various neurologic complaints.  MR without concerning findings. TSH wnl. At this time unclear etiology of the patient's symptoms, however given negative work up in the emergency department I do think it is reasonable for patient to be discharged to follow up with outpatient providers. Will give neurology follow up information.  FINAL CLINICAL IMPRESSION(S) / ED DIAGNOSES   Final diagnoses:  Weakness  Change in vision  Left leg weakness      Note:  This document was prepared using Dragon voice recognition software and may include unintentional dictation errors.    Nance Pear, MD 10/08/21 [phone]

## 2021-10-10 LAB — LYME DISEASE SEROLOGY W/REFLEX: Lyme Total Antibody EIA: NEGATIVE

## 2021-11-13 IMAGING — NM NM HEPATO W/GB/PHARM/[PERSON_NAME]
4 series · 22 of 22 positions shown · non-contrast
Comparison: None

CLINICAL DATA: Upper abdominal pain, chronic, cholelithiasis

EXAM:
NUCLEAR MEDICINE HEPATOBILIARY IMAGING WITH GALLBLADDER EF
TECHNIQUE: Sequential images of the abdomen were obtained [DATE] minutes
following intravenous administration of radiopharmaceutical. After
oral ingestion of Ensure, gallbladder ejection fraction was
determined. At 60 min, normal ejection fraction is greater than 33%.
RADIOPHARMACEUTICALS:  5.07 mCi Ac-RRm  Choletec IV

[Series 1000: gallbladder delays part 2 (concatenated) · 3.30mm/px · 8 of 8 slices shown]
[im 1/8]
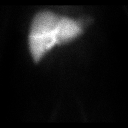
[im 2/8]
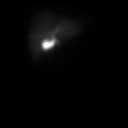
[im 3/8]
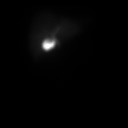
[im 4/8]
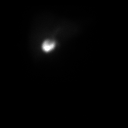
[im 5/8]
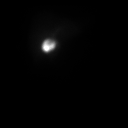
[im 6/8]
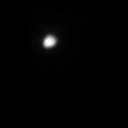
[im 7/8]
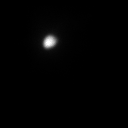
[im 8/8]
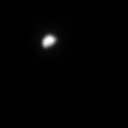

[Series 1000: gallbladder delays · 3.30mm/px · 5 of 5 slices shown]
[im 1/5]
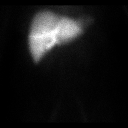
[im 2/5]
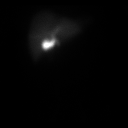
[im 3/5]
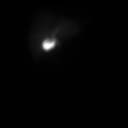
[im 4/5]
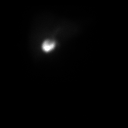
[im 5/5]
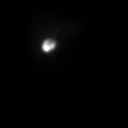

[Series 1000: gallbladder ef · 4.80mm/px · 6 of 120 frames shown]
[frame 11/120]
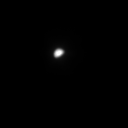
[frame 31/120]
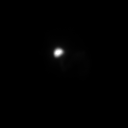
[frame 51/120]
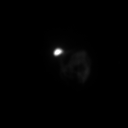
[frame 71/120]
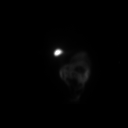
[frame 91/120]
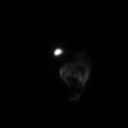
[frame 111/120]
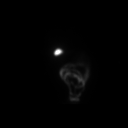

[Series 1000: gallbladder delays part 2 · 3.30mm/px · 3 of 3 slices shown]
[im 1/3]
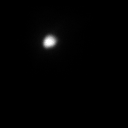
[im 2/3]
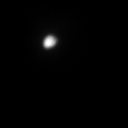
[im 3/3]
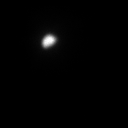

[22 of 22 positions shown; findings below may reference images not displayed]

FINDINGS: Normal tracer extraction from bloodstream indicating normal
hepatocellular function.

Normal excretion of tracer into biliary tree.

Gallbladder visualized at 15 min.

Small bowel not visualized until following fatty meal stimulation

No hepatic retention of tracer.

Subjectively normal emptying of tracer from gallbladder following
fatty meal stimulation.

Calculated gallbladder ejection fraction is 60%, normal.

Patient reported mild abdominal cramping 45 minutes following Ensure
ingestion.

Normal gallbladder ejection fraction following Ensure ingestion is
greater than 33% at 1 hour.
IMPRESSION: Patent biliary tree.

Normal gallbladder ejection fraction of 60% following fatty meal
stimulation.

Patient reported mild abdominal cramping following Ensure.

## 2021-12-27 ENCOUNTER — Ambulatory Visit: Payer: 59 | Admitting: Podiatry

## 2021-12-27 ENCOUNTER — Encounter: Payer: Self-pay | Admitting: Podiatry

## 2022-01-03 ENCOUNTER — Ambulatory Visit: Payer: 59 | Admitting: Podiatry

## 2022-01-03 VITALS — BP 120/80

## 2022-01-03 DIAGNOSIS — B351 Tinea unguium: Secondary | ICD-10-CM

## 2022-01-03 DIAGNOSIS — M7752 Other enthesopathy of left foot: Secondary | ICD-10-CM

## 2022-01-03 DIAGNOSIS — Q666 Other congenital valgus deformities of feet: Secondary | ICD-10-CM

## 2022-01-03 DIAGNOSIS — M7751 Other enthesopathy of right foot: Secondary | ICD-10-CM

## 2022-01-03 DIAGNOSIS — Z79899 Other long term (current) drug therapy: Secondary | ICD-10-CM

## 2022-01-03 NOTE — Progress Notes (Signed)
Subjective:  Patient ID: Mackenzie Curry, female    DOB: November 17, 1985,  MRN: [id number]  Chief Complaint  Patient presents with   Nail Problem    Thick discolored nail     37 y.o. female presents with the above complaint.  Patient presents with bilateral fourth metatarsophalangeal joint capsulitis.  Patient states is painful to touch is progressive gotten worse worse with ambulation hurts with pressure.  She has secondary complaint of thickened elongated dystrophic toenails x 1 to the left hallux she is also had a history of microtrauma to the nail.  She would like to discuss treatment options for this.  She has not seen anyone else prior to seeing me for this.  Denies any other acute complaints.   Review of Systems: Negative except as noted in the HPI. Denies N/V/F/Ch.  Past Medical History:  Diagnosis Date   ADD (attention deficit disorder)    Allergy    Anxiety    Depression    IBS (irritable bowel syndrome)    No pertinent past medical history    PONV (postoperative nausea and vomiting)     Current Outpatient Medications:    buPROPion (WELLBUTRIN XL) 150 MG 24 hr tablet, Take 1 tablet (150 mg total) by mouth every morning., Disp: 90 tablet, Rfl: 3  Social History   Tobacco Use  Smoking Status Former   Types: Cigarettes   Quit date: 08/06/2015   Years since quitting: 6.4  Smokeless Tobacco Never    Allergies  Allergen Reactions   Sulfa Antibiotics Anaphylaxis   Objective:   Vitals:   01/03/22 0838  BP: 120/80   There is no height or weight on file to calculate BMI. Constitutional Well developed. Well nourished.  Vascular Dorsalis pedis pulses palpable bilaterally. Posterior tibial pulses palpable bilaterally. Capillary refill normal to all digits.  No cyanosis or clubbing noted. Pedal hair growth normal.  Neurologic Normal speech. Oriented to person, place, and time. Epicritic sensation to light touch grossly present bilaterally.  Dermatologic Left hallux  thickened elongated dystrophic mycotic toenails x 1 signs of microtrauma as well.  Signs of nail fungus as well No open wounds. No skin lesions.  Orthopedic: Pain on palpation bilateral hallux MPJ joint pain with range of motion of the joint limited range of motion noted findings consistent with some osteoarthritic changes as well as hallux limitus close to rigidus.   Radiographs: None Assessment:   1. Long-term use of high-risk medication   2. Nail fungus   3. Onychomycosis due to dermatophyte   4. Pes planovalgus   5. Capsulitis of metatarsophalangeal (MTP) joint of right foot   6. Capsulitis of metatarsophalangeal (MTP) joint of left foot    Plan:  Patient was evaluated and treated and all questions answered.  Left hallux onychomycosis -Educated the patient on the etiology of onychomycosis and various treatment options associated with improving the fungal load.  I explained to the patient that there is 3 treatment options available to treat the onychomycosis including topical, p.o., laser treatment.  Patient elected to undergo p.o. options with Lamisil/terbinafine therapy.  In order for me to start the medication therapy, I explained to the patient the importance of evaluating the liver and obtaining the liver function test.  Once the liver function test comes back normal I will start him on 22-monthcourse of Lamisil therapy.  Patient understood all risk and would like to proceed with Lamisil therapy.  I have asked the patient to immediately stop the Lamisil therapy if she  has any reactions to it and call the office or go to the emergency room right away.  Patient states understanding   Bilateral first MTP capsulitis with underlying hallux limitus -All questions and concerns were discussed with the patient in extensive detail.  Given the amount of pain that she is experienced she will benefit from a steroid injection to help decrease acute inflammatory component associate with pain.   Patient agrees with plan like to proceed with steroid injection. -A steroid injection was performed at bilateral first MTP using 1% plain Lidocaine and 10 mg of Kenalog. This was well tolerated.  Pes planovalgus -I explained to patient the etiology of pes planovalgus and relationship with bilateral first MTP capsulitis and various treatment options were discussed.  Given patient foot structure in the setting of bilateral first MTP capsulitis I believe patient will benefit from custom-made orthotics to help control the hindfoot motion support the arch of the foot and take the stress away from plantar fascial.  Patient agrees with the plan like to proceed with orthotics -Patient was casted for orthotics    No follow-ups on file.

## 2022-02-08 ENCOUNTER — Ambulatory Visit (INDEPENDENT_AMBULATORY_CARE_PROVIDER_SITE_OTHER): Payer: 59

## 2022-02-08 DIAGNOSIS — Q666 Other congenital valgus deformities of feet: Secondary | ICD-10-CM

## 2022-02-19 NOTE — Progress Notes (Signed)
Patient presents today to pick up custom molded foot orthotics recommended by Dr. Posey Pronto.   Orthotics were dispensed and fit was satisfactory. Reviewed instructions for break-in and wear. Written instructions given to patient.  Patient will follow up as needed.

## 2022-04-04 ENCOUNTER — Other Ambulatory Visit (HOSPITAL_COMMUNITY): Payer: Self-pay | Admitting: Infectious Diseases

## 2022-04-04 DIAGNOSIS — N644 Mastodynia: Secondary | ICD-10-CM

## 2022-04-06 ENCOUNTER — Other Ambulatory Visit (HOSPITAL_COMMUNITY): Payer: Self-pay

## 2022-04-06 MED ORDER — LISDEXAMFETAMINE DIMESYLATE 30 MG PO CAPS
60.0000 mg | ORAL_CAPSULE | Freq: Every morning | ORAL | 0 refills | Status: DC
  Filled 2022-04-06: qty 60, 30d supply, fill #0

## 2022-05-02 ENCOUNTER — Telehealth: Payer: Self-pay | Admitting: Podiatry

## 2022-05-02 ENCOUNTER — Ambulatory Visit: Payer: 59 | Admitting: Podiatry

## 2022-05-02 ENCOUNTER — Other Ambulatory Visit (HOSPITAL_COMMUNITY): Payer: Self-pay

## 2022-05-02 MED ORDER — LISDEXAMFETAMINE DIMESYLATE 30 MG PO CAPS
ORAL_CAPSULE | ORAL | 0 refills | Status: DC
  Filled 2022-05-02 – 2022-05-04 (×2): qty 60, 30d supply, fill #0

## 2022-05-02 NOTE — Telephone Encounter (Signed)
Pt left message today at 433am today stating she and her son are sick with vomiting and she tested positive for the flu. She wants to cxl appt.   I have cxled appt.

## 2022-05-03 ENCOUNTER — Other Ambulatory Visit (HOSPITAL_COMMUNITY): Payer: Self-pay

## 2022-05-03 ENCOUNTER — Encounter (HOSPITAL_COMMUNITY): Payer: Self-pay

## 2022-05-03 ENCOUNTER — Ambulatory Visit (HOSPITAL_COMMUNITY)
Admission: RE | Admit: 2022-05-03 | Discharge: 2022-05-03 | Disposition: A | Discharge status: Home / Self Care | Payer: 59 | Source: Ambulatory Visit | Attending: Infectious Diseases | Admitting: Infectious Diseases

## 2022-05-03 DIAGNOSIS — N644 Mastodynia: Secondary | ICD-10-CM | POA: Diagnosis present

## 2022-05-04 ENCOUNTER — Other Ambulatory Visit (HOSPITAL_COMMUNITY): Payer: Self-pay

## 2022-05-27 ENCOUNTER — Other Ambulatory Visit (HOSPITAL_COMMUNITY): Payer: Self-pay

## 2022-05-29 ENCOUNTER — Other Ambulatory Visit (HOSPITAL_COMMUNITY): Payer: Self-pay

## 2022-05-29 MED ORDER — LISDEXAMFETAMINE DIMESYLATE 30 MG PO CAPS
60.0000 mg | ORAL_CAPSULE | Freq: Every morning | ORAL | 0 refills | Status: DC
  Filled 2022-05-29 – 2022-05-30 (×2): qty 60, 30d supply, fill #0

## 2022-05-30 ENCOUNTER — Other Ambulatory Visit (HOSPITAL_COMMUNITY): Payer: Self-pay

## 2022-05-31 ENCOUNTER — Other Ambulatory Visit: Payer: Self-pay

## 2022-05-31 ENCOUNTER — Other Ambulatory Visit (HOSPITAL_COMMUNITY): Payer: Self-pay

## 2022-06-01 ENCOUNTER — Other Ambulatory Visit (HOSPITAL_COMMUNITY): Payer: Self-pay

## 2022-07-02 ENCOUNTER — Other Ambulatory Visit: Payer: Self-pay

## 2022-07-02 ENCOUNTER — Other Ambulatory Visit (HOSPITAL_COMMUNITY): Payer: Self-pay

## 2022-07-02 MED ORDER — LISDEXAMFETAMINE DIMESYLATE 30 MG PO CAPS
30.0000 mg | ORAL_CAPSULE | Freq: Every morning | ORAL | 0 refills | Status: DC
  Filled 2022-07-02: qty 30, 30d supply, fill #0
  Filled 2022-07-16: qty 30, 30d supply, fill #1

## 2022-07-02 MED ORDER — OMEPRAZOLE 40 MG PO CPDR
40.0000 mg | DELAYED_RELEASE_CAPSULE | Freq: Every day | ORAL | 11 refills | Status: DC
  Filled 2022-07-02: qty 30, 30d supply, fill #0
  Filled 2022-07-16 – 2022-10-18 (×2): qty 30, 30d supply, fill #1
  Filled 2022-11-18: qty 30, 30d supply, fill #2
  Filled 2022-12-19: qty 30, 30d supply, fill #3
  Filled 2023-01-16: qty 30, 30d supply, fill #4
  Filled 2023-04-01: qty 30, 30d supply, fill #5
  Filled 2023-06-10: qty 30, 30d supply, fill #6

## 2022-07-02 MED ORDER — VITAMIN D (ERGOCALCIFEROL) 1.25 MG (50000 UNIT) PO CAPS
50000.0000 [IU] | ORAL_CAPSULE | ORAL | 11 refills | Status: DC
  Filled 2022-07-02: qty 4, 28d supply, fill #0
  Filled 2022-07-16 – 2022-10-18 (×2): qty 4, 28d supply, fill #1
  Filled 2022-11-18: qty 4, 28d supply, fill #2
  Filled 2022-12-19: qty 4, 28d supply, fill #3
  Filled 2023-01-08 – 2023-01-09 (×2): qty 4, 28d supply, fill #4
  Filled 2023-02-06: qty 4, 28d supply, fill #5
  Filled 2023-03-04: qty 4, 28d supply, fill #6
  Filled 2023-04-01: qty 4, 28d supply, fill #7
  Filled 2023-04-29: qty 4, 28d supply, fill #8
  Filled 2023-06-10: qty 4, 28d supply, fill #9

## 2022-07-16 ENCOUNTER — Other Ambulatory Visit (HOSPITAL_COMMUNITY): Payer: Self-pay

## 2022-07-18 ENCOUNTER — Other Ambulatory Visit (HOSPITAL_COMMUNITY): Payer: Self-pay

## 2022-07-18 MED ORDER — LISDEXAMFETAMINE DIMESYLATE 30 MG PO CAPS
60.0000 mg | ORAL_CAPSULE | Freq: Every morning | ORAL | 0 refills | Status: DC
  Filled 2022-07-18 – 2022-07-19 (×3): qty 60, 30d supply, fill #0

## 2022-07-19 ENCOUNTER — Other Ambulatory Visit: Payer: Self-pay

## 2022-07-19 ENCOUNTER — Other Ambulatory Visit (HOSPITAL_COMMUNITY): Payer: Self-pay

## 2022-08-13 ENCOUNTER — Other Ambulatory Visit
Admission: RE | Admit: 2022-08-13 | Discharge: 2022-08-13 | Disposition: A | Discharge status: Home / Self Care | Payer: 59 | Source: Ambulatory Visit | Attending: Infectious Diseases | Admitting: Infectious Diseases

## 2022-08-13 DIAGNOSIS — R600 Localized edema: Secondary | ICD-10-CM | POA: Insufficient documentation

## 2022-08-13 LAB — BRAIN NATRIURETIC PEPTIDE: B Natriuretic Peptide: 8.7 pg/mL (ref 0.0–100.0)

## 2022-08-21 ENCOUNTER — Encounter (HOSPITAL_COMMUNITY): Payer: Self-pay

## 2022-08-21 ENCOUNTER — Other Ambulatory Visit (HOSPITAL_COMMUNITY): Payer: Self-pay

## 2022-08-21 ENCOUNTER — Other Ambulatory Visit: Payer: Self-pay

## 2022-08-21 MED ORDER — LISDEXAMFETAMINE DIMESYLATE 30 MG PO CAPS
30.0000 mg | ORAL_CAPSULE | Freq: Two times a day (BID) | ORAL | 0 refills | Status: DC
  Filled 2022-08-21: qty 60, 30d supply, fill #0

## 2022-08-21 MED ORDER — LISDEXAMFETAMINE DIMESYLATE 30 MG PO CAPS
30.0000 mg | ORAL_CAPSULE | Freq: Every morning | ORAL | 0 refills | Status: DC
  Filled 2022-08-21 – 2022-09-19 (×2): qty 30, 30d supply, fill #0

## 2022-08-22 ENCOUNTER — Other Ambulatory Visit: Payer: Self-pay

## 2022-09-19 ENCOUNTER — Other Ambulatory Visit (HOSPITAL_COMMUNITY): Payer: Self-pay

## 2022-09-19 MED ORDER — LISDEXAMFETAMINE DIMESYLATE 30 MG PO CAPS
30.0000 mg | ORAL_CAPSULE | Freq: Two times a day (BID) | ORAL | 0 refills | Status: DC
  Filled 2022-09-19: qty 60, 30d supply, fill #0

## 2022-10-18 ENCOUNTER — Other Ambulatory Visit (HOSPITAL_COMMUNITY): Payer: Self-pay

## 2022-10-18 ENCOUNTER — Other Ambulatory Visit: Payer: Self-pay

## 2022-10-19 ENCOUNTER — Other Ambulatory Visit (HOSPITAL_COMMUNITY): Payer: Self-pay

## 2022-10-19 MED ORDER — AMPHETAMINE-DEXTROAMPHETAMINE 15 MG PO TABS
15.0000 mg | ORAL_TABLET | Freq: Every morning | ORAL | 0 refills | Status: DC
  Filled 2022-10-19 – 2022-10-24 (×3): qty 30, 30d supply, fill #0

## 2022-10-19 MED ORDER — LISDEXAMFETAMINE DIMESYLATE 30 MG PO CAPS
30.0000 mg | ORAL_CAPSULE | Freq: Two times a day (BID) | ORAL | 0 refills | Status: DC
  Filled 2022-10-19: qty 60, 30d supply, fill #0

## 2022-10-20 ENCOUNTER — Other Ambulatory Visit (HOSPITAL_COMMUNITY): Payer: Self-pay

## 2022-10-22 ENCOUNTER — Other Ambulatory Visit: Payer: Self-pay

## 2022-10-22 ENCOUNTER — Other Ambulatory Visit (HOSPITAL_COMMUNITY): Payer: Self-pay

## 2022-10-24 ENCOUNTER — Other Ambulatory Visit (HOSPITAL_COMMUNITY): Payer: Self-pay

## 2022-10-25 ENCOUNTER — Other Ambulatory Visit (HOSPITAL_COMMUNITY): Payer: Self-pay

## 2022-10-31 ENCOUNTER — Other Ambulatory Visit: Payer: Self-pay | Admitting: Infectious Diseases

## 2022-10-31 DIAGNOSIS — R222 Localized swelling, mass and lump, trunk: Secondary | ICD-10-CM

## 2022-11-18 ENCOUNTER — Other Ambulatory Visit (HOSPITAL_COMMUNITY): Payer: Self-pay

## 2022-11-19 ENCOUNTER — Other Ambulatory Visit: Payer: Self-pay

## 2022-11-19 ENCOUNTER — Other Ambulatory Visit (HOSPITAL_COMMUNITY): Payer: Self-pay

## 2022-11-19 MED ORDER — AMPHETAMINE-DEXTROAMPHETAMINE 15 MG PO TABS
1.0000 | ORAL_TABLET | Freq: Every morning | ORAL | 0 refills | Status: DC
  Filled 2022-11-19 – 2022-11-21 (×2): qty 30, 30d supply, fill #0

## 2022-11-19 MED ORDER — LISDEXAMFETAMINE DIMESYLATE 30 MG PO CAPS
30.0000 mg | ORAL_CAPSULE | Freq: Two times a day (BID) | ORAL | 0 refills | Status: DC
  Filled 2022-11-19: qty 60, 30d supply, fill #0

## 2022-11-20 ENCOUNTER — Other Ambulatory Visit: Payer: Self-pay

## 2022-11-20 ENCOUNTER — Other Ambulatory Visit (HOSPITAL_COMMUNITY): Payer: Self-pay

## 2022-11-21 ENCOUNTER — Other Ambulatory Visit (HOSPITAL_COMMUNITY): Payer: Self-pay

## 2022-11-22 ENCOUNTER — Other Ambulatory Visit (HOSPITAL_COMMUNITY): Payer: Self-pay

## 2022-12-12 ENCOUNTER — Other Ambulatory Visit (HOSPITAL_COMMUNITY): Payer: Self-pay

## 2022-12-12 MED ORDER — BUPROPION HCL ER (XL) 150 MG PO TB24
150.0000 mg | ORAL_TABLET | Freq: Every morning | ORAL | 1 refills | Status: DC
  Filled 2022-12-12: qty 30, 30d supply, fill #0
  Filled 2023-01-16: qty 30, 30d supply, fill #1
  Filled 2023-02-28: qty 30, 30d supply, fill #2
  Filled 2023-04-01: qty 30, 30d supply, fill #3
  Filled 2023-05-09: qty 30, 30d supply, fill #4
  Filled 2023-06-10: qty 30, 30d supply, fill #5

## 2022-12-12 MED ORDER — LISDEXAMFETAMINE DIMESYLATE 70 MG PO CAPS
70.0000 mg | ORAL_CAPSULE | Freq: Every morning | ORAL | 0 refills | Status: DC
  Filled 2022-12-12: qty 30, 30d supply, fill #0

## 2022-12-19 ENCOUNTER — Other Ambulatory Visit (HOSPITAL_COMMUNITY): Payer: Self-pay

## 2022-12-19 ENCOUNTER — Other Ambulatory Visit: Payer: Self-pay

## 2022-12-21 ENCOUNTER — Other Ambulatory Visit (HOSPITAL_COMMUNITY): Payer: Self-pay

## 2022-12-21 MED ORDER — AMPHETAMINE-DEXTROAMPHETAMINE 15 MG PO TABS
1.0000 | ORAL_TABLET | Freq: Every morning | ORAL | 0 refills | Status: DC
  Filled 2022-12-21: qty 30, 30d supply, fill #0

## 2023-01-08 ENCOUNTER — Other Ambulatory Visit (HOSPITAL_COMMUNITY): Payer: Self-pay

## 2023-01-08 ENCOUNTER — Other Ambulatory Visit (HOSPITAL_BASED_OUTPATIENT_CLINIC_OR_DEPARTMENT_OTHER): Payer: Self-pay

## 2023-01-08 MED ORDER — LISDEXAMFETAMINE DIMESYLATE 70 MG PO CAPS
70.0000 mg | ORAL_CAPSULE | Freq: Every morning | ORAL | 0 refills | Status: DC
  Filled 2023-01-09: qty 30, 30d supply, fill #0

## 2023-01-09 ENCOUNTER — Other Ambulatory Visit (HOSPITAL_COMMUNITY): Payer: Self-pay

## 2023-01-16 ENCOUNTER — Other Ambulatory Visit (HOSPITAL_COMMUNITY): Payer: Self-pay

## 2023-01-16 ENCOUNTER — Other Ambulatory Visit: Payer: Self-pay

## 2023-01-17 ENCOUNTER — Other Ambulatory Visit (HOSPITAL_COMMUNITY): Payer: Self-pay

## 2023-01-17 MED ORDER — AMPHETAMINE-DEXTROAMPHETAMINE 15 MG PO TABS
15.0000 mg | ORAL_TABLET | Freq: Every morning | ORAL | 0 refills | Status: DC
  Filled 2023-01-18 – 2023-01-19 (×2): qty 30, 30d supply, fill #0

## 2023-01-18 ENCOUNTER — Other Ambulatory Visit (HOSPITAL_COMMUNITY): Payer: Self-pay

## 2023-01-19 ENCOUNTER — Other Ambulatory Visit (HOSPITAL_COMMUNITY): Payer: Self-pay

## 2023-02-04 ENCOUNTER — Other Ambulatory Visit (HOSPITAL_COMMUNITY): Payer: Self-pay

## 2023-02-04 MED ORDER — LISDEXAMFETAMINE DIMESYLATE 70 MG PO CAPS
70.0000 mg | ORAL_CAPSULE | Freq: Every morning | ORAL | 0 refills | Status: DC
  Filled 2023-02-06: qty 30, 30d supply, fill #0

## 2023-02-06 ENCOUNTER — Other Ambulatory Visit (HOSPITAL_COMMUNITY): Payer: Self-pay

## 2023-02-06 ENCOUNTER — Other Ambulatory Visit: Payer: Self-pay

## 2023-02-06 MED ORDER — AMPHETAMINE-DEXTROAMPHETAMINE 15 MG PO TABS
15.0000 mg | ORAL_TABLET | Freq: Every morning | ORAL | 0 refills | Status: DC
  Filled 2023-02-06 – 2023-02-16 (×2): qty 30, 30d supply, fill #0

## 2023-02-07 ENCOUNTER — Other Ambulatory Visit (HOSPITAL_COMMUNITY): Payer: Self-pay

## 2023-02-16 ENCOUNTER — Other Ambulatory Visit (HOSPITAL_COMMUNITY): Payer: Self-pay

## 2023-03-04 ENCOUNTER — Other Ambulatory Visit: Payer: Self-pay

## 2023-03-04 ENCOUNTER — Other Ambulatory Visit (HOSPITAL_COMMUNITY): Payer: Self-pay

## 2023-03-05 ENCOUNTER — Other Ambulatory Visit (HOSPITAL_COMMUNITY): Payer: Self-pay

## 2023-03-05 MED ORDER — AMPHETAMINE-DEXTROAMPHETAMINE 15 MG PO TABS
1.0000 | ORAL_TABLET | Freq: Every morning | ORAL | 0 refills | Status: DC
  Filled 2023-03-07 – 2023-03-18 (×3): qty 30, 30d supply, fill #0

## 2023-03-05 MED ORDER — LISDEXAMFETAMINE DIMESYLATE 70 MG PO CAPS
70.0000 mg | ORAL_CAPSULE | Freq: Every morning | ORAL | 0 refills | Status: DC
  Filled 2023-03-06: qty 30, 30d supply, fill #0

## 2023-03-06 ENCOUNTER — Other Ambulatory Visit: Payer: Self-pay

## 2023-03-06 ENCOUNTER — Other Ambulatory Visit (HOSPITAL_COMMUNITY): Payer: Self-pay

## 2023-03-07 ENCOUNTER — Other Ambulatory Visit (HOSPITAL_COMMUNITY): Payer: Self-pay

## 2023-03-11 ENCOUNTER — Encounter (HOSPITAL_COMMUNITY): Payer: Self-pay

## 2023-03-11 ENCOUNTER — Other Ambulatory Visit (HOSPITAL_BASED_OUTPATIENT_CLINIC_OR_DEPARTMENT_OTHER): Payer: Self-pay

## 2023-03-12 ENCOUNTER — Other Ambulatory Visit (HOSPITAL_COMMUNITY): Payer: Self-pay

## 2023-03-18 ENCOUNTER — Other Ambulatory Visit (HOSPITAL_COMMUNITY): Payer: Self-pay

## 2023-04-01 ENCOUNTER — Other Ambulatory Visit: Payer: Self-pay

## 2023-04-01 ENCOUNTER — Other Ambulatory Visit (HOSPITAL_COMMUNITY): Payer: Self-pay

## 2023-04-02 ENCOUNTER — Other Ambulatory Visit (HOSPITAL_COMMUNITY): Payer: Self-pay

## 2023-04-02 MED ORDER — AMPHETAMINE-DEXTROAMPHETAMINE 15 MG PO TABS
1.0000 | ORAL_TABLET | Freq: Every morning | ORAL | 0 refills | Status: DC
Start: 2023-04-02 — End: 2023-05-03
  Filled 2023-04-17: qty 30, 30d supply, fill #0

## 2023-04-02 MED ORDER — LISDEXAMFETAMINE DIMESYLATE 70 MG PO CAPS
70.0000 mg | ORAL_CAPSULE | Freq: Every morning | ORAL | 0 refills | Status: AC
  Filled 2023-04-02: qty 30, 30d supply, fill #0
  Filled 2023-04-04: qty 20, 20d supply, fill #0
  Filled 2023-04-04: qty 10, 10d supply, fill #0

## 2023-04-04 ENCOUNTER — Other Ambulatory Visit (HOSPITAL_COMMUNITY): Payer: Self-pay

## 2023-04-05 ENCOUNTER — Other Ambulatory Visit (HOSPITAL_COMMUNITY): Payer: Self-pay

## 2023-04-06 ENCOUNTER — Other Ambulatory Visit (HOSPITAL_COMMUNITY): Payer: Self-pay

## 2023-04-17 ENCOUNTER — Other Ambulatory Visit (HOSPITAL_COMMUNITY): Payer: Self-pay

## 2023-04-29 ENCOUNTER — Other Ambulatory Visit (HOSPITAL_COMMUNITY): Payer: Self-pay

## 2023-05-03 ENCOUNTER — Other Ambulatory Visit (HOSPITAL_COMMUNITY): Payer: Self-pay

## 2023-05-03 MED ORDER — LISDEXAMFETAMINE DIMESYLATE 70 MG PO CAPS
70.0000 mg | ORAL_CAPSULE | Freq: Every morning | ORAL | 0 refills | Status: DC
  Filled 2023-05-03: qty 30, 30d supply, fill #0

## 2023-05-03 MED ORDER — AMPHETAMINE-DEXTROAMPHETAMINE 15 MG PO TABS
15.0000 mg | ORAL_TABLET | Freq: Every morning | ORAL | 0 refills | Status: DC
  Filled 2023-05-09 – 2023-05-15 (×4): qty 30, 30d supply, fill #0

## 2023-05-09 ENCOUNTER — Other Ambulatory Visit (HOSPITAL_COMMUNITY): Payer: Self-pay

## 2023-05-09 ENCOUNTER — Other Ambulatory Visit: Payer: Self-pay

## 2023-05-14 ENCOUNTER — Other Ambulatory Visit (HOSPITAL_COMMUNITY): Payer: Self-pay

## 2023-05-15 ENCOUNTER — Other Ambulatory Visit (HOSPITAL_COMMUNITY): Payer: Self-pay

## 2023-05-15 ENCOUNTER — Other Ambulatory Visit: Payer: Self-pay

## 2023-05-26 ENCOUNTER — Other Ambulatory Visit (HOSPITAL_COMMUNITY): Payer: Self-pay

## 2023-05-28 ENCOUNTER — Other Ambulatory Visit (HOSPITAL_COMMUNITY): Payer: Self-pay

## 2023-05-28 MED ORDER — LISDEXAMFETAMINE DIMESYLATE 70 MG PO CAPS
70.0000 mg | ORAL_CAPSULE | Freq: Every morning | ORAL | 0 refills | Status: DC
  Filled 2023-05-28 – 2023-05-31 (×3): qty 30, 30d supply, fill #0

## 2023-05-30 ENCOUNTER — Other Ambulatory Visit (HOSPITAL_COMMUNITY): Payer: Self-pay

## 2023-05-31 ENCOUNTER — Other Ambulatory Visit (HOSPITAL_COMMUNITY): Payer: Self-pay

## 2023-06-10 ENCOUNTER — Other Ambulatory Visit (HOSPITAL_COMMUNITY): Payer: Self-pay

## 2023-06-11 ENCOUNTER — Other Ambulatory Visit: Payer: Self-pay

## 2023-06-11 ENCOUNTER — Other Ambulatory Visit (HOSPITAL_COMMUNITY): Payer: Self-pay

## 2023-06-11 MED ORDER — AMPHETAMINE-DEXTROAMPHETAMINE 15 MG PO TABS
1.0000 | ORAL_TABLET | Freq: Every morning | ORAL | 0 refills | Status: DC
  Filled 2023-06-12: qty 30, 30d supply, fill #0

## 2023-06-13 ENCOUNTER — Other Ambulatory Visit (HOSPITAL_COMMUNITY): Payer: Self-pay

## 2023-06-25 ENCOUNTER — Other Ambulatory Visit (HOSPITAL_COMMUNITY): Payer: Self-pay

## 2023-06-26 ENCOUNTER — Other Ambulatory Visit (HOSPITAL_COMMUNITY): Payer: Self-pay

## 2023-06-26 MED ORDER — LISDEXAMFETAMINE DIMESYLATE 70 MG PO CAPS
70.0000 mg | ORAL_CAPSULE | Freq: Every morning | ORAL | 0 refills | Status: DC
  Filled 2023-06-29: qty 30, 30d supply, fill #0

## 2023-06-29 ENCOUNTER — Other Ambulatory Visit (HOSPITAL_COMMUNITY): Payer: Self-pay

## 2023-07-11 ENCOUNTER — Other Ambulatory Visit (HOSPITAL_COMMUNITY): Payer: Self-pay

## 2023-07-13 ENCOUNTER — Other Ambulatory Visit (HOSPITAL_COMMUNITY): Payer: Self-pay

## 2023-07-15 ENCOUNTER — Other Ambulatory Visit (HOSPITAL_COMMUNITY): Payer: Self-pay

## 2023-07-15 MED ORDER — AMPHETAMINE-DEXTROAMPHETAMINE 15 MG PO TABS
1.0000 | ORAL_TABLET | Freq: Every morning | ORAL | 0 refills | Status: DC
  Filled 2023-07-15 – 2023-08-13 (×2): qty 30, 30d supply, fill #0

## 2023-07-16 ENCOUNTER — Other Ambulatory Visit (HOSPITAL_COMMUNITY): Payer: Self-pay

## 2023-07-16 MED ORDER — VITAMIN D (ERGOCALCIFEROL) 1.25 MG (50000 UNIT) PO CAPS
50000.0000 [IU] | ORAL_CAPSULE | ORAL | 11 refills | Status: AC
  Filled 2023-07-16 – 2023-07-28 (×2): qty 4, 28d supply, fill #0
  Filled 2023-08-28: qty 4, 28d supply, fill #1
  Filled 2023-09-22: qty 4, 28d supply, fill #2
  Filled 2023-10-24: qty 4, 28d supply, fill #3
  Filled 2023-12-11: qty 4, 28d supply, fill #4

## 2023-07-16 MED ORDER — LISDEXAMFETAMINE DIMESYLATE 70 MG PO CAPS
70.0000 mg | ORAL_CAPSULE | Freq: Every morning | ORAL | 0 refills | Status: DC
  Filled 2023-07-28: qty 30, 30d supply, fill #0

## 2023-07-17 ENCOUNTER — Other Ambulatory Visit (HOSPITAL_COMMUNITY): Payer: Self-pay

## 2023-07-26 ENCOUNTER — Other Ambulatory Visit (HOSPITAL_COMMUNITY): Payer: Self-pay

## 2023-07-28 ENCOUNTER — Other Ambulatory Visit (HOSPITAL_COMMUNITY): Payer: Self-pay

## 2023-07-29 ENCOUNTER — Other Ambulatory Visit: Payer: Self-pay

## 2023-07-29 ENCOUNTER — Other Ambulatory Visit (HOSPITAL_COMMUNITY): Payer: Self-pay

## 2023-07-29 MED ORDER — BUPROPION HCL ER (XL) 150 MG PO TB24
150.0000 mg | ORAL_TABLET | Freq: Every morning | ORAL | 1 refills | Status: AC
  Filled 2023-07-29: qty 30, 30d supply, fill #0
  Filled 2023-08-28: qty 30, 30d supply, fill #1
  Filled 2023-09-24: qty 30, 30d supply, fill #2
  Filled 2023-10-24: qty 30, 30d supply, fill #3
  Filled 2023-12-11: qty 30, 30d supply, fill #4

## 2023-08-13 ENCOUNTER — Other Ambulatory Visit: Payer: Self-pay

## 2023-08-13 ENCOUNTER — Other Ambulatory Visit (HOSPITAL_COMMUNITY): Payer: Self-pay

## 2023-08-28 ENCOUNTER — Other Ambulatory Visit: Payer: Self-pay | Admitting: Infectious Diseases

## 2023-08-28 ENCOUNTER — Other Ambulatory Visit (HOSPITAL_COMMUNITY): Payer: Self-pay

## 2023-08-28 DIAGNOSIS — R222 Localized swelling, mass and lump, trunk: Secondary | ICD-10-CM

## 2023-08-28 DIAGNOSIS — R600 Localized edema: Secondary | ICD-10-CM

## 2023-08-29 ENCOUNTER — Other Ambulatory Visit (HOSPITAL_COMMUNITY): Payer: Self-pay

## 2023-08-29 ENCOUNTER — Other Ambulatory Visit: Payer: Self-pay

## 2023-08-29 MED ORDER — OMEPRAZOLE 40 MG PO CPDR
40.0000 mg | DELAYED_RELEASE_CAPSULE | Freq: Every day | ORAL | 11 refills | Status: AC
  Filled 2023-08-29: qty 30, 30d supply, fill #0
  Filled 2023-09-24: qty 30, 30d supply, fill #1
  Filled 2023-10-24: qty 30, 30d supply, fill #2

## 2023-08-29 MED ORDER — LISDEXAMFETAMINE DIMESYLATE 70 MG PO CAPS
70.0000 mg | ORAL_CAPSULE | Freq: Every morning | ORAL | 0 refills | Status: DC
  Filled 2023-08-29: qty 30, 30d supply, fill #0

## 2023-09-12 ENCOUNTER — Other Ambulatory Visit (HOSPITAL_COMMUNITY): Payer: Self-pay

## 2023-09-13 ENCOUNTER — Other Ambulatory Visit: Payer: Self-pay

## 2023-09-13 ENCOUNTER — Other Ambulatory Visit (HOSPITAL_COMMUNITY): Payer: Self-pay

## 2023-09-13 MED ORDER — AMPHETAMINE-DEXTROAMPHETAMINE 15 MG PO TABS
1.0000 | ORAL_TABLET | Freq: Every morning | ORAL | 0 refills | Status: DC
  Filled 2023-09-13: qty 30, 30d supply, fill #0

## 2023-09-22 ENCOUNTER — Other Ambulatory Visit (HOSPITAL_COMMUNITY): Payer: Self-pay

## 2023-09-23 ENCOUNTER — Other Ambulatory Visit (HOSPITAL_COMMUNITY): Payer: Self-pay

## 2023-09-23 ENCOUNTER — Other Ambulatory Visit: Payer: Self-pay

## 2023-09-24 ENCOUNTER — Other Ambulatory Visit (HOSPITAL_COMMUNITY): Payer: Self-pay

## 2023-09-24 MED ORDER — LISDEXAMFETAMINE DIMESYLATE 70 MG PO CAPS
70.0000 mg | ORAL_CAPSULE | Freq: Every morning | ORAL | 0 refills | Status: DC
  Filled 2023-09-25 – 2023-09-27 (×2): qty 30, 30d supply, fill #0

## 2023-09-25 ENCOUNTER — Other Ambulatory Visit (HOSPITAL_COMMUNITY): Payer: Self-pay

## 2023-09-26 ENCOUNTER — Other Ambulatory Visit (HOSPITAL_COMMUNITY): Payer: Self-pay

## 2023-09-27 ENCOUNTER — Other Ambulatory Visit (HOSPITAL_COMMUNITY): Payer: Self-pay

## 2023-10-11 ENCOUNTER — Other Ambulatory Visit (HOSPITAL_COMMUNITY): Payer: Self-pay

## 2023-10-11 MED ORDER — AMPHETAMINE-DEXTROAMPHETAMINE 15 MG PO TABS
1.0000 | ORAL_TABLET | Freq: Every morning | ORAL | 0 refills | Status: AC
  Filled 2023-10-11: qty 30, 30d supply, fill #0

## 2023-10-24 ENCOUNTER — Other Ambulatory Visit (HOSPITAL_COMMUNITY): Payer: Self-pay

## 2023-10-24 ENCOUNTER — Other Ambulatory Visit: Payer: Self-pay

## 2023-10-25 ENCOUNTER — Other Ambulatory Visit (HOSPITAL_COMMUNITY): Payer: Self-pay

## 2023-10-25 MED ORDER — LISDEXAMFETAMINE DIMESYLATE 70 MG PO CAPS
70.0000 mg | ORAL_CAPSULE | Freq: Every morning | ORAL | 0 refills | Status: DC
  Filled 2023-10-25: qty 30, 30d supply, fill #0

## 2023-11-07 ENCOUNTER — Other Ambulatory Visit (HOSPITAL_COMMUNITY): Payer: Self-pay

## 2023-11-12 ENCOUNTER — Other Ambulatory Visit (HOSPITAL_COMMUNITY): Payer: Self-pay

## 2023-11-12 MED ORDER — AMPHETAMINE-DEXTROAMPHETAMINE 15 MG PO TABS
1.0000 | ORAL_TABLET | Freq: Every morning | ORAL | 0 refills | Status: AC
  Filled 2023-11-12: qty 30, 30d supply, fill #0

## 2023-11-23 ENCOUNTER — Other Ambulatory Visit (HOSPITAL_COMMUNITY): Payer: Self-pay

## 2023-11-25 ENCOUNTER — Other Ambulatory Visit (HOSPITAL_COMMUNITY): Payer: Self-pay

## 2023-11-25 ENCOUNTER — Encounter (HOSPITAL_COMMUNITY): Payer: Self-pay

## 2023-11-25 MED ORDER — LISDEXAMFETAMINE DIMESYLATE 70 MG PO CAPS
70.0000 mg | ORAL_CAPSULE | Freq: Every morning | ORAL | 0 refills | Status: DC
  Filled 2023-11-25: qty 30, 30d supply, fill #0

## 2023-11-26 ENCOUNTER — Other Ambulatory Visit (HOSPITAL_COMMUNITY): Payer: Self-pay

## 2023-12-10 ENCOUNTER — Other Ambulatory Visit (HOSPITAL_COMMUNITY): Payer: Self-pay

## 2023-12-11 ENCOUNTER — Other Ambulatory Visit: Payer: Self-pay

## 2023-12-11 ENCOUNTER — Other Ambulatory Visit (HOSPITAL_COMMUNITY): Payer: Self-pay

## 2023-12-11 MED ORDER — AMPHETAMINE-DEXTROAMPHETAMINE 15 MG PO TABS
15.0000 mg | ORAL_TABLET | Freq: Every morning | ORAL | 0 refills | Status: DC
  Filled 2023-12-11: qty 20, 20d supply, fill #0
  Filled 2023-12-11: qty 10, 10d supply, fill #0

## 2023-12-18 ENCOUNTER — Other Ambulatory Visit (HOSPITAL_COMMUNITY): Payer: Self-pay

## 2023-12-18 MED ORDER — LISDEXAMFETAMINE DIMESYLATE 70 MG PO CAPS
70.0000 mg | ORAL_CAPSULE | Freq: Every morning | ORAL | 0 refills | Status: DC
  Filled 2023-12-18 – 2023-12-23 (×3): qty 30, 30d supply, fill #0

## 2023-12-20 ENCOUNTER — Encounter (HOSPITAL_COMMUNITY): Payer: Self-pay

## 2023-12-20 ENCOUNTER — Other Ambulatory Visit (HOSPITAL_COMMUNITY): Payer: Self-pay

## 2023-12-23 ENCOUNTER — Other Ambulatory Visit (HOSPITAL_COMMUNITY): Payer: Self-pay

## 2023-12-23 ENCOUNTER — Other Ambulatory Visit: Payer: Self-pay

## 2023-12-24 ENCOUNTER — Other Ambulatory Visit (HOSPITAL_COMMUNITY): Payer: Self-pay

## 2023-12-27 ENCOUNTER — Other Ambulatory Visit (HOSPITAL_COMMUNITY): Payer: Self-pay

## 2024-01-06 ENCOUNTER — Other Ambulatory Visit (HOSPITAL_COMMUNITY): Payer: Self-pay

## 2024-01-07 ENCOUNTER — Other Ambulatory Visit (HOSPITAL_COMMUNITY): Payer: Self-pay

## 2024-01-07 MED ORDER — AMPHETAMINE-DEXTROAMPHETAMINE 15 MG PO TABS
1.0000 | ORAL_TABLET | Freq: Every morning | ORAL | 0 refills | Status: AC
  Filled 2024-01-07 – 2024-01-10 (×2): qty 30, 30d supply, fill #0

## 2024-01-10 ENCOUNTER — Other Ambulatory Visit (HOSPITAL_COMMUNITY): Payer: Self-pay

## 2024-01-12 ENCOUNTER — Other Ambulatory Visit (HOSPITAL_COMMUNITY): Payer: Self-pay

## 2024-01-22 ENCOUNTER — Other Ambulatory Visit (HOSPITAL_COMMUNITY): Payer: Self-pay

## 2024-01-22 MED ORDER — LISDEXAMFETAMINE DIMESYLATE 70 MG PO CAPS
70.0000 mg | ORAL_CAPSULE | Freq: Every morning | ORAL | 0 refills | Status: AC
  Filled 2024-01-22: qty 30, 30d supply, fill #0
